# Patient Record
Sex: Female | Born: 1965 | Race: White | Hispanic: No | Marital: Single | Smoking: Current every day smoker
Health system: Southern US, Community
[De-identification: ages and names within clinical notes are randomized; demographics above are authoritative.]

## PROBLEM LIST (undated history)

## (undated) DIAGNOSIS — K5010 Crohn's disease of large intestine without complications: Secondary | ICD-10-CM

## (undated) DIAGNOSIS — M1990 Unspecified osteoarthritis, unspecified site: Secondary | ICD-10-CM

## (undated) DIAGNOSIS — E119 Type 2 diabetes mellitus without complications: Secondary | ICD-10-CM

## (undated) DIAGNOSIS — E785 Hyperlipidemia, unspecified: Secondary | ICD-10-CM

## (undated) DIAGNOSIS — I10 Essential (primary) hypertension: Secondary | ICD-10-CM

## (undated) HISTORY — PX: ABDOMINAL HYSTERECTOMY: SHX81

## (undated) HISTORY — PX: BACK SURGERY: SHX140

## (undated) HISTORY — PX: APPENDECTOMY: SHX54

## (undated) HISTORY — PX: KNEE ARTHROSCOPY: SUR90

## (undated) HISTORY — PX: BREAST SURGERY: SHX581

## (undated) HISTORY — PX: CARPAL TUNNEL RELEASE: SHX101

## (undated) HISTORY — PX: DIAGNOSTIC LAPAROSCOPY: SUR761

## (undated) HISTORY — DX: Essential (primary) hypertension: I10

## (undated) HISTORY — DX: Hyperlipidemia, unspecified: E78.5

## (undated) HISTORY — PX: CHOLECYSTECTOMY: SHX55

## (undated) HISTORY — DX: Crohn's disease of large intestine without complications: K50.10

---

## 2003-01-28 ENCOUNTER — Encounter: Admission: RE | Admit: 2003-01-28 | Discharge: 2003-01-28 | Payer: Self-pay | Admitting: Neurosurgery

## 2003-01-28 ENCOUNTER — Encounter: Payer: Self-pay | Admitting: Neurosurgery

## 2003-03-25 ENCOUNTER — Encounter: Payer: Self-pay | Admitting: Neurosurgery

## 2003-03-27 ENCOUNTER — Inpatient Hospital Stay (HOSPITAL_COMMUNITY): Admission: RE | Admit: 2003-03-27 | Discharge: 2003-03-30 | Payer: Self-pay | Admitting: Neurosurgery

## 2003-03-27 ENCOUNTER — Encounter: Payer: Self-pay | Admitting: Neurosurgery

## 2007-09-12 DIAGNOSIS — K5010 Crohn's disease of large intestine without complications: Secondary | ICD-10-CM

## 2007-09-12 HISTORY — DX: Crohn's disease of large intestine without complications: K50.10

## 2009-03-24 ENCOUNTER — Encounter: Admission: RE | Admit: 2009-03-24 | Discharge: 2009-03-24 | Payer: Self-pay | Admitting: Neurosurgery

## 2011-01-27 NOTE — Op Note (Signed)
Barbara Dyer, Barbara Dyer                             ACCOUNT NO.:  [id number]   MEDICAL RECORD NO.:  [id number]                   PATIENT TYPE:  INP   LOCATION:                                       FACILITY:  [facility]   PHYSICIAN:  Payton Doughty, M.D.                   DATE OF BIRTH:  Nov 09, 1965   DATE OF PROCEDURE:  03/27/2003  DATE OF DISCHARGE:  03/30/2003                                 OPERATIVE REPORT   PREOPERATIVE DIAGNOSIS:  Spondylosis L5-S1.   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   SURGEON:  Payton Doughty, M.D.   ANESTHESIA:  General endotracheal anesthesia.   PREPARATION:  The prep was scrub with alcohol wipe.   COMPLICATIONS:  None.   PROCEDURE:  L5-S1 laminectomy, diskectomy, posterior lumbar interbody fusion  with Ray anterior fusion cages and posterolateral arthrodesis with VITOSS  and DBX.   ASSISTANTS:  Nurse assistant Basilia Jumbo. Doctor assistant Stefani Dama,  M.D.   INDICATIONS FOR PROCEDURE:  This is a 46 year old girl with low back pain  and lumbar radiculopathy.   DESCRIPTION OF PROCEDURE:  She was taken to the operating room, intubated,  and placed prone on the operating table. Following sterile prepping and  draping in the usual sterile fashion, the skin was infiltrated with 1%  Lidocaine with 1:400,000 epinephrine.   The skin was incised from mid S1 to mid L4 and then the lamina of L4, L5 and  S1 were exposed bilaterally  in the subperiosteal plane including  the  transverse process of L5 in the sacral ala. Interoperative x-ray confirmed  the correct level. Having confirmed the correct level, the pars  intraarticularis, lamina and inferior facet of L5 and the superior facet of  S1 were removed bilaterally  with the high-speed drill and the bone was set  aside for grafting.   The right-sided anatomy demonstrated a little bit larger facet than on the  left of 5-1 with some facet arthropathy. The  left side demonstrated a pars  defect, spina bifida. Both sides were well decompressed and the ligament of  Flavum was dissected free and removed. The epidural fat was dissected free  and the L5 and S1 nerve roots were decompressed bilaterally.   A 5-1 diskectomy was then carried and Ray fusion cages were placed 12 x 21  mm. Interoperative x-rays showed good placement of the cages. They were  packed with bone graft harvested from the facet joints. The DBX was mixed  with VITOSS in the remaining bone, which was placed in the lateral gutter in  a transverse position after the transverse process of L5 and the sacral ala  had been decorticated with the high-speed drill. Prior to this the wound was  irrigated and hemostasis was assured.   The fascia was reapproximated with 0  Vicryl in an interrupted fashion. The  subcutaneous tissue was reapproximated with 0 Vicryl in an interrupted  fashion. The subcuticular tissue was reapproximated with 3-0 Vicryl in an  interrupted fashion. The skin was closed with 3-0 nylon in a running locked  fashion. A Betadine Telfa dressing was applied and an occlusive with OpSite  and the patient returned to the recovery room in good condition.                                                Payton Doughty, M.D.    MWR/MEDQ  D:  03/27/2003  T:  03/28/2003  Job:  [id number]

## 2011-01-27 NOTE — Discharge Summary (Signed)
   NAMEALANIE, Barbara Dyer                             ACCOUNT NO.:  [id number]   MEDICAL RECORD NO.:  [id number]                   PATIENT TYPE:  INP   LOCATION:  3005                                 FACILITY:  [facility]   PHYSICIAN:  Payton Doughty, M.D.                   DATE OF BIRTH:  1965-12-23   DATE OF ADMISSION:  03/27/2003  DATE OF DISCHARGE:  03/30/2003                                 DISCHARGE SUMMARY   ADMISSION DIAGNOSIS:  Spondylosis L5-S1.   DISCHARGE DIAGNOSIS:  Spondylolysis L5-S1 and spina bifida L5.   PROCEDURE:  L5-S1 laminectomy, diskectomy, lumbar interbody fusion with  right sided fusion cage and posterolateral arthrodesis.   COMPLICATIONS:  None.   CONDITION ON DISCHARGE:  Alive and well.   HISTORY OF PRESENT ILLNESS:  A 45 year old right-handed white girl whose  history and physical is recanted on the chart. She has had a long history of  back pain and positive diskography. Medical history has otherwise benign.   HOSPITAL COURSE:  She was admitted after ascertaining normal laboratory  values and underwent a 5-1 fusion.  Postoperatively, she has done reasonably  well.  PCA was removed the second postoperative day and Foley taken out the  first.  She participated in physical therapy and is up and walking about.  She had some numbness in her hands related to positioning which were  resolved. Incision is dry and well-healing. Strength is full.  She is being  discharged home in the care of her family with Percocet for pain.   FOLLOW UP:  In the [facility] office in a week for  sutures.                                               Payton Doughty, M.D.    MWR/MEDQ  D:  03/30/2003  T:  03/31/2003  Job:  [id number]

## 2011-01-27 NOTE — H&P (Signed)
Barbara Dyer, Barbara Dyer                             ACCOUNT NO.:  [id number]   MEDICAL RECORD NO.:  [id number]                   PATIENT TYPE:  INP   LOCATION:                                       FACILITY:  [facility]   PHYSICIAN:  Payton Doughty, M.D.                   DATE OF BIRTH:  03/29/1966   DATE OF ADMISSION:  03/27/2003  DATE OF DISCHARGE:  03/30/2003                                HISTORY & PHYSICAL   ADMISSION DIAGNOSIS:  Spondylosis at L5-S1.   HISTORY OF PRESENT ILLNESS:  This is a 45 year old right-handed white girl  who has had pain in her back and down her leg, and intermittent numbness in  her foot.  Participated in physical therapy.  She is on methadone which I  stopped.  She underwent epidural steroids which did not help her much, and  then she underwent diskography.  It was strongly and concurrently positive  at L5-S1, and she is now admitted for lumbar fusion.   PAST MEDICAL HISTORY:  Hormone difficulties.   MEDICATIONS:  1. Estratest once a day.  2. Clonidine daily for nerves.  3. Calcium.  4. Vitamin C.  5. Vitamin E.  6. Fiber and laxative.  7. A weight pill.  8. Chromium.   ALLERGIES:  1. NEURONTIN.  2. SULINDAC.   PAST SURGICAL HISTORY:  1. Knee operation in 2002.  2. Hysterectomy in 2000.  3. Appendectomy in 1999.  4. History of endometriosis.   SOCIAL HISTORY:  She smokes a pack of cigarettes a day, does not drink  alcohol.  Is a housewife.   FAMILY HISTORY:  Her mom is 34, good health with diabetes.  Dad is 34, in  good health.  She has a sister with spondylolysis.  Has had effusion.   REVIEW OF SYSTEMS:  Remarkable for fever, night sweats, _____________, sinus  problems, headaches, sore throat, hypercholesterolemia, changes in her hands  and feet, leg pain while walking, nausea, ulcers, abdominal pain, bowel  habit change, urinary tract infections, blood in the urine, endometriosis,  arm weakness, leg weakness, back pain, inability to  concentrate, anxiety and  depression.   PHYSICAL EXAMINATION:  HEENT:  Within normal limits.  NECK:  She has reasonable range of motion of the neck.  CHEST:  A few crackles.  CARDIOVASCULAR:  Regular rate and rhythm.  ABDOMEN:  Nontender, no hepatosplenomegaly.  EXTREMITIES:  Without clubbing or cyanosis.  Peripheral pulses are good.  GENITOURINARY:  Deferred.  NEUROLOGIC:  She is awake, alert, and oriented.  Cranial nerves are intact.  Motor exam shows 5/5 strength throughout the upper and lower extremities.  Reflexes are 1 at the knees and left ankle, absent at the right.  Straight  leg raise is mildly positive on the right.   LABORATORY DATA:  MRI demonstrates degenerative disk disease at L5-S1 with a  right-sided annular  bulge.   IMPRESSION:  Right S1 radiculopathy and degenerative disk disease at L5-S1.   PLAN:  Lumbar laminectomy and diskectomy, posterior lumbar interbody fusion  with a Ray threaded fusion cage and intertransverse bone.  The risks and  benefits of this approach has been discussed with her and she wishes to  proceed.                                                 Payton Doughty, M.D.    MWR/MEDQ  D:  03/27/2003  T:  03/28/2003  Job:  [id number]

## 2013-11-24 ENCOUNTER — Other Ambulatory Visit: Payer: Self-pay | Admitting: Neurosurgery

## 2013-11-24 DIAGNOSIS — M545 Low back pain, unspecified: Secondary | ICD-10-CM

## 2013-11-28 ENCOUNTER — Ambulatory Visit
Admission: RE | Admit: 2013-11-28 | Discharge: 2013-11-28 | Disposition: A | Discharge status: Home / Self Care | Payer: BC Managed Care – PPO | Source: Ambulatory Visit | Attending: Neurosurgery | Admitting: Neurosurgery

## 2013-11-28 VITALS — BP 109/81 | HR 94

## 2013-11-28 DIAGNOSIS — M545 Low back pain, unspecified: Secondary | ICD-10-CM

## 2013-11-28 DIAGNOSIS — M542 Cervicalgia: Secondary | ICD-10-CM

## 2013-11-28 MED ORDER — DIAZEPAM 5 MG PO TABS
10.0000 mg | ORAL_TABLET | Freq: Once | ORAL | Status: AC
Start: 2013-11-28 — End: 2013-11-28
  Administered 2013-11-28: 10 mg via ORAL

## 2013-11-28 MED ORDER — IOHEXOL 300 MG/ML  SOLN
10.0000 mL | Freq: Once | INTRAMUSCULAR | Status: AC | PRN
  Administered 2013-11-28: 10 mL via EPIDURAL

## 2013-11-28 MED ORDER — MEPERIDINE HCL 100 MG/ML IJ SOLN
100.0000 mg | Freq: Once | INTRAMUSCULAR | Status: AC
  Administered 2013-11-28: 100 mg via INTRAMUSCULAR

## 2013-11-28 MED ORDER — ONDANSETRON HCL 4 MG/2ML IJ SOLN
4.0000 mg | Freq: Once | INTRAMUSCULAR | Status: AC
Start: 2013-11-28 — End: 2013-11-28
  Administered 2013-11-28: 4 mg via INTRAMUSCULAR

## 2013-11-28 NOTE — Discharge Instructions (Signed)

## 2014-12-15 ENCOUNTER — Other Ambulatory Visit: Payer: Self-pay | Admitting: Physician Assistant

## 2014-12-15 NOTE — H&P (Signed)
TOTAL KNEE ADMISSION H&P  Patient is being admitted for right total knee arthroplasty.  Subjective:  Chief Complaint:right knee pain.  HPI: Barbara Dyer, 49 y.o. female, has a history of pain and functional disability in the right knee due to arthritis and has failed non-surgical conservative treatments for greater than 12 weeks to includeNSAID's and/or analgesics, corticosteriod injections and use of assistive devices.  Onset of symptoms was gradual, starting >10 years ago with rapidlly worsening course since that time. The patient noted prior procedures on the knee to include  arthroscopy and menisectomy on the right knee(s).  Patient currently rates pain in the right knee(s) at 7 out of 10 with activity. Patient has night pain, worsening of pain with activity and weight bearing and crepitus.  Patient has evidence of subchondral sclerosis and joint space narrowing by imaging studies. There is no active infection.  There are no active problems to display for this patient.  No past medical history on file.  No past surgical history on file.   (Not in a hospital admission) Allergies  Allergen Reactions  . Morphine And Related Hives and Other (See Comments)    Tachycardia   . Prednisone Hives and Other (See Comments)    Tachycardia (with PO steroids; has never had steroid injections)   . Humulin [Insulin Nph Isophane & Regular] Hives and Rash  . Tape Dermatitis    History  Substance Use Topics  . Smoking status: Not on file  . Smokeless tobacco: Not on file  . Alcohol Use: Not on file    No family history on file.   Review of Systems  Constitutional: Negative.   HENT: Negative.   Eyes: Negative.   Respiratory: Negative.   Cardiovascular: Negative.   Gastrointestinal: Negative.   Genitourinary: Positive for frequency. Negative for dysuria, urgency and hematuria.  Musculoskeletal: Positive for joint pain.  Skin: Negative.   Neurological: Negative.   Endo/Heme/Allergies:  Negative.   Psychiatric/Behavioral: Negative.     Objective:  Physical Exam  Constitutional: She is oriented to person, place, and time. She appears well-developed and well-nourished.  HENT:  Head: Normocephalic and atraumatic.  Eyes: EOM are normal. Pupils are equal, round, and reactive to light.  Neck: Normal range of motion. Neck supple.  Cardiovascular: Normal rate and regular rhythm.  Exam reveals no gallop and no friction rub.   No murmur heard. Respiratory: Effort normal and breath sounds normal. No respiratory distress. She has no wheezes. She has no rales.  GI: Soft. Bowel sounds are normal. She exhibits no distension. There is no tenderness.  Musculoskeletal:  Markedly antalgic gait.  Utilizes a cane. Negative log roll both hips, no trochanteric tenderness.  Both knees have reasonable alignment.  She can still kind of full extension, and better than 100 degrees of flexion.  Relatively  profound patellofemoral crepitus, right greater than left.  Marked pain with patellar compression.  A little tender medial and lateral joint line, both knees.   Neurological: She is alert and oriented to person, place, and time.  Skin: Skin is warm and dry.  Psychiatric: She has a normal mood and affect. Her behavior is normal. Judgment and thought content normal.    Vital signs in last 24 hours: @VSRANGES @  Labs:   There is no height or weight on file to calculate BMI.   Imaging Review Plain radiographs demonstrate severe degenerative joint disease of the right knee(s). The overall alignment isneutral. The bone quality appears to be fair for age and reported activity  level.  Assessment/Plan:  End stage arthritis, right knee   The patient history, physical examination, clinical judgment of the provider and imaging studies are consistent with end stage degenerative joint disease of the right knee(s) and total knee arthroplasty is deemed medically necessary. The treatment options  including medical management, injection therapy arthroscopy and arthroplasty were discussed at length. The risks and benefits of total knee arthroplasty were presented and reviewed. The risks due to aseptic loosening, infection, stiffness, patella tracking problems, thromboembolic complications and other imponderables were discussed. The patient acknowledged the explanation, agreed to proceed with the plan and consent was signed. Patient is being admitted for inpatient treatment for surgery, pain control, PT, OT, prophylactic antibiotics, VTE prophylaxis, progressive ambulation and ADL's and discharge planning. The patient is planning to be discharged home with home health services

## 2014-12-18 ENCOUNTER — Inpatient Hospital Stay (HOSPITAL_COMMUNITY)
Admission: RE | Admit: 2014-12-18 | Discharge: 2014-12-18 | Disposition: A | Discharge status: Home / Self Care | Payer: Self-pay | Source: Ambulatory Visit

## 2014-12-18 NOTE — Pre-Procedure Instructions (Signed)
Barbara Dyer  12/18/2014   Your procedure is scheduled on:  12/30/14  Report to [facility] cone short stay admitting at 800 AM.  Call this number if you have problems the morning of surgery: [phone]   Remember:   Do not eat food or drink liquids after midnight.   Take these medicines the morning of surgery with A SIP OF WATER: tylenol if needed   STOP all herbel meds, nsaids (aleve,naproxen,advil,ibuprofen) 5 days prior to surgery starting 12/25/14 including Multi vit, black cohosh, calcium, digestive enzyme, digestive advantage,flaxseed.aspirin     Do not wear jewelry, make-up or nail polish.  Do not wear lotions, powders, or perfumes. You may wear deodorant.  Do not shave 48 hours prior to surgery. Men may shave face and neck.  Do not bring valuables to the hospital.  [facility] is not responsible                  for any belongings or valuables.               Contacts, dentures or bridgework may not be worn into surgery.  Leave suitcase in the car. After surgery it may be brought to your room.  For patients admitted to the hospital, discharge time is determined by your                treatment team.               Patients discharged the day of surgery will not be allowed to drive  home.  Name and phone number of your driver:   Special Instructions:  Special Instructions: [facility] - Preparing for Surgery  Before surgery, you can play an important role.  Because skin is not sterile, your skin needs to be as free of germs as possible.  You can reduce the number of germs on you skin by washing with CHG (chlorahexidine gluconate) soap before surgery.  CHG is an antiseptic cleaner which kills germs and bonds with the skin to continue killing germs even after washing.  Please DO NOT use if you have an allergy to CHG or antibacterial soaps.  If your skin becomes reddened/irritated stop using the CHG and inform your nurse when you arrive at Short Stay.  Do not shave (including legs and  underarms) for at least 48 hours prior to the first CHG shower.  You may shave your face.  Please follow these instructions carefully:   1.  Shower with CHG Soap the night before surgery and the morning of Surgery.  2.  If you choose to wash your hair, wash your hair first as usual with your normal shampoo.  3.  After you shampoo, rinse your hair and body thoroughly to remove the Shampoo.  4.  Use CHG as you would any other liquid soap.  You can apply chg directly  to the skin and wash gently with scrungie or a clean washcloth.  5.  Apply the CHG Soap to your body ONLY FROM THE NECK DOWN.  Do not use on open wounds or open sores.  Avoid contact with your eyes ears, mouth and genitals (private parts).  Wash genitals (private parts)       with your normal soap.  6.  Wash thoroughly, paying special attention to the area where your surgery will be performed.  7.  Thoroughly rinse your body with warm water from the neck down.  8.  DO NOT shower/wash with your normal soap after using and  rinsing off the CHG Soap.  9.  Pat yourself dry with a clean towel.            10.  Wear clean pajamas.            11.  Place clean sheets on your bed the night of your first shower and do not sleep with pets.  Day of Surgery  Do not apply any lotions/deodorants the morning of surgery.  Please wear clean clothes to the hospital/surgery center.   Please read over the following fact sheets that you were given: Pain Booklet, Coughing and Deep Breathing, Blood Transfusion Information, Total Joint Packet, MRSA Information and Surgical Site Infection Prevention

## 2014-12-25 ENCOUNTER — Encounter (HOSPITAL_COMMUNITY): Payer: Self-pay

## 2014-12-25 ENCOUNTER — Encounter (HOSPITAL_COMMUNITY)
Admission: RE | Admit: 2014-12-25 | Discharge: 2014-12-25 | Disposition: A | Discharge status: Home / Self Care | Payer: BLUE CROSS/BLUE SHIELD | Source: Ambulatory Visit | Attending: Orthopedic Surgery | Admitting: Orthopedic Surgery

## 2014-12-25 DIAGNOSIS — Z01812 Encounter for preprocedural laboratory examination: Secondary | ICD-10-CM | POA: Diagnosis not present

## 2014-12-25 HISTORY — DX: Unspecified osteoarthritis, unspecified site: M19.90

## 2014-12-25 HISTORY — DX: Type 2 diabetes mellitus without complications: E11.9

## 2014-12-25 LAB — COMPREHENSIVE METABOLIC PANEL
ALBUMIN: 4.2 g/dL (ref 3.5–5.2)
ALT: 19 U/L (ref 0–35)
ANION GAP: 13 (ref 5–15)
AST: 26 U/L (ref 0–37)
Alkaline Phosphatase: 113 U/L (ref 39–117)
BUN: 9 mg/dL (ref 6–23)
CALCIUM: 9.9 mg/dL (ref 8.4–10.5)
CO2: 24 mmol/L (ref 19–32)
CREATININE: 0.67 mg/dL (ref 0.50–1.10)
Chloride: 101 mmol/L (ref 96–112)
Glucose, Bld: 154 mg/dL — ABNORMAL HIGH (ref 70–99)
Potassium: 4.5 mmol/L (ref 3.5–5.1)
Sodium: 138 mmol/L (ref 135–145)
Total Bilirubin: 0.4 mg/dL (ref 0.3–1.2)
Total Protein: 7.2 g/dL (ref 6.0–8.3)

## 2014-12-25 LAB — URINE MICROSCOPIC-ADD ON

## 2014-12-25 LAB — URINALYSIS, ROUTINE W REFLEX MICROSCOPIC
Bilirubin Urine: NEGATIVE
Glucose, UA: NEGATIVE mg/dL
Ketones, ur: NEGATIVE mg/dL
LEUKOCYTES UA: NEGATIVE
Nitrite: NEGATIVE
Protein, ur: NEGATIVE mg/dL
Specific Gravity, Urine: 1.023 (ref 1.005–1.030)
UROBILINOGEN UA: 0.2 mg/dL (ref 0.0–1.0)
pH: 5.5 (ref 5.0–8.0)

## 2014-12-25 LAB — PROTIME-INR
INR: 0.97 (ref 0.00–1.49)
Prothrombin Time: 13 seconds (ref 11.6–15.2)

## 2014-12-25 LAB — CBC WITH DIFFERENTIAL/PLATELET
BASOS ABS: 0.1 10*3/uL (ref 0.0–0.1)
Basophils Relative: 1 % (ref 0–1)
Eosinophils Absolute: 0.1 10*3/uL (ref 0.0–0.7)
Eosinophils Relative: 1 % (ref 0–5)
HCT: 46.7 % — ABNORMAL HIGH (ref 36.0–46.0)
Hemoglobin: 16.5 g/dL — ABNORMAL HIGH (ref 12.0–15.0)
Lymphocytes Relative: 27 % (ref 12–46)
Lymphs Abs: 2.9 10*3/uL (ref 0.7–4.0)
MCH: 32.6 pg (ref 26.0–34.0)
MCHC: 35.3 g/dL (ref 30.0–36.0)
MCV: 92.3 fL (ref 78.0–100.0)
Monocytes Absolute: 0.6 10*3/uL (ref 0.1–1.0)
Monocytes Relative: 6 % (ref 3–12)
Neutro Abs: 6.8 10*3/uL (ref 1.7–7.7)
Neutrophils Relative %: 65 % (ref 43–77)
Platelets: 250 10*3/uL (ref 150–400)
RBC: 5.06 MIL/uL (ref 3.87–5.11)
RDW: 12.2 % (ref 11.5–15.5)
WBC: 10.4 10*3/uL (ref 4.0–10.5)

## 2014-12-25 LAB — SURGICAL PCR SCREEN
MRSA, PCR: NEGATIVE
STAPHYLOCOCCUS AUREUS: NEGATIVE

## 2014-12-25 LAB — ABO/RH: ABO/RH(D): O POS

## 2014-12-25 LAB — APTT: APTT: 32 s (ref 24–37)

## 2014-12-25 LAB — TYPE AND SCREEN
ABO/RH(D): O POS
Antibody Screen: NEGATIVE

## 2014-12-25 NOTE — Progress Notes (Addendum)
req'd stress test done last week, ekg, office notes from dr Karl Pock [place]/[facility] [phone]

## 2014-12-25 NOTE — Pre-Procedure Instructions (Addendum)
Barbara Dyer  12/25/2014   Your procedure is scheduled on:  12/30/14  Report to [facility] short stay admitting at 800 AM.  Call this number if you have problems the morning of surgery: [phone]   Remember:   Do not eat food or drink liquids after midnight.   Take these medicines the morning of surgery with A SIP OF WATER: tylenol if needed   STOP all herbel meds, nsaids (aleve,naproxen,advil,ibuprofen) 5 days prior to surgery starting 12/25/14 including Multi vit, black cohosh, calcium, digestive enzyme, digestive advantage,flaxseed.aspirin           No metformin am of surgery    Do not wear jewelry, make-up or nail polish.  Do not wear lotions, powders, or perfumes. You may wear deodorant.  Do not shave 48 hours prior to surgery. Men may shave face and neck.  Do not bring valuables to the hospital.  [facility] is not responsible                  for any belongings or valuables.               Contacts, dentures or bridgework may not be worn into surgery.  Leave suitcase in the car. After surgery it may be brought to your room.  For patients admitted to the hospital, discharge time is determined by your                treatment team.               Patients discharged the day of surgery will not be allowed to drive  home.  Name and phone number of your driver:   Special Instructions:  Special Instructions: [facility] - Preparing for Surgery  Before surgery, you can play an important role.  Because skin is not sterile, your skin needs to be as free of germs as possible.  You can reduce the number of germs on you skin by washing with CHG (chlorahexidine gluconate) soap before surgery.  CHG is an antiseptic cleaner which kills germs and bonds with the skin to continue killing germs even after washing.  Please DO NOT use if you have an allergy to CHG or antibacterial soaps.  If your skin becomes reddened/irritated stop using the CHG and inform your nurse when you arrive at Short  Stay.  Do not shave (including legs and underarms) for at least 48 hours prior to the first CHG shower.  You may shave your face.  Please follow these instructions carefully:   1.  Shower with CHG Soap the night before surgery and the morning of Surgery.  2.  If you choose to wash your hair, wash your hair first as usual with your normal shampoo.  3.  After you shampoo, rinse your hair and body thoroughly to remove the Shampoo.  4.  Use CHG as you would any other liquid soap.  You can apply chg directly  to the skin and wash gently with scrungie or a clean washcloth.  5.  Apply the CHG Soap to your body ONLY FROM THE NECK DOWN.  Do not use on open wounds or open sores.  Avoid contact with your eyes ears, mouth and genitals (private parts).  Wash genitals (private parts)       with your normal soap.  6.  Wash thoroughly, paying special attention to the area where your surgery will be performed.  7.  Thoroughly rinse your body with warm water from the neck  down.  8.  DO NOT shower/wash with your normal soap after using and rinsing off the CHG Soap.  9.  Pat yourself dry with a clean towel.            10.  Wear clean pajamas.            11.  Place clean sheets on your bed the night of your first shower and do not sleep with pets.  Day of Surgery  Do not apply any lotions/deodorants the morning of surgery.  Please wear clean clothes to the hospital/surgery center.   Please read over the following fact sheets that you were given: Pain Booklet, Coughing and Deep Breathing, Blood Transfusion Information, Total Joint Packet, MRSA Information and Surgical Site Infection Prevention

## 2014-12-26 LAB — URINE CULTURE: Colony Count: 85000

## 2014-12-28 NOTE — Progress Notes (Signed)
Anesthesia Chart Review:  Patient is a 49 year old female scheduled for right TKA on 12/30/14 by Dr. Kathryne Dyer.  History includes smoking, DM2, arthritis, hysterectomy, back surgery.  PCP is Dr. Abelino Dyer in [place] [facility]; see Care Everywhere). Dr. Ethelene Dyer cleared her for this procedure (Dr. Debroah Dyer office to fax clearance.)  She had a negative dobutamine stress echo on 12/18/14. EF 60%, no resting segmental abnormality, no clinical or echocardiographic evidence of ischemia, 7METS.   12/04/14 EKG: ST at 100 bpm, poor r wave progression consider anterior infarct (age undetermined), low QRS voltage in precordial leads.  Preoperative labs noted. Urine culture showed 85,000 colonies, non-predominant.  If no acute changes then I anticipate that she can proceed as planned.  Barbara Dyer [facility] Short Stay Center/Anesthesiology Phone [phone] 12/28/2014 3:04 PM

## 2014-12-29 MED ORDER — LACTATED RINGERS IV SOLN
INTRAVENOUS | Status: DC
  Administered 2014-12-30: 09:00:00 via INTRAVENOUS

## 2014-12-29 MED ORDER — CHLORHEXIDINE GLUCONATE 4 % EX LIQD
60.0000 mL | Freq: Once | CUTANEOUS | Status: DC
Start: 2014-12-29 — End: 2014-12-30
  Filled 2014-12-29: qty 60

## 2014-12-29 MED ORDER — CHLORHEXIDINE GLUCONATE 4 % EX LIQD
60.0000 mL | Freq: Once | CUTANEOUS | Status: DC
  Filled 2014-12-29: qty 60

## 2014-12-29 MED ORDER — CEFAZOLIN SODIUM-DEXTROSE 2-3 GM-% IV SOLR
2.0000 g | INTRAVENOUS | Status: AC
  Administered 2014-12-30: 2 g via INTRAVENOUS
  Filled 2014-12-29: qty 50

## 2014-12-30 ENCOUNTER — Inpatient Hospital Stay (HOSPITAL_COMMUNITY)
Admission: RE | Admit: 2014-12-30 | Discharge: 2015-01-01 | DRG: 470 | Disposition: A | Discharge status: Home Health | Payer: BLUE CROSS/BLUE SHIELD | Source: Ambulatory Visit | Attending: Orthopedic Surgery | Admitting: Orthopedic Surgery

## 2014-12-30 ENCOUNTER — Inpatient Hospital Stay (HOSPITAL_COMMUNITY): Payer: BLUE CROSS/BLUE SHIELD | Admitting: Anesthesiology

## 2014-12-30 ENCOUNTER — Encounter (HOSPITAL_COMMUNITY): Payer: Self-pay | Admitting: *Deleted

## 2014-12-30 ENCOUNTER — Other Ambulatory Visit: Payer: Self-pay

## 2014-12-30 ENCOUNTER — Inpatient Hospital Stay (HOSPITAL_COMMUNITY): Payer: BLUE CROSS/BLUE SHIELD

## 2014-12-30 ENCOUNTER — Encounter (HOSPITAL_COMMUNITY)
Admission: RE | Disposition: A | Discharge status: Home Health | Payer: Self-pay | Source: Ambulatory Visit | Attending: Orthopedic Surgery

## 2014-12-30 ENCOUNTER — Inpatient Hospital Stay (HOSPITAL_COMMUNITY): Payer: BLUE CROSS/BLUE SHIELD | Admitting: Vascular Surgery

## 2014-12-30 DIAGNOSIS — Z79899 Other long term (current) drug therapy: Secondary | ICD-10-CM

## 2014-12-30 DIAGNOSIS — T402X5A Adverse effect of other opioids, initial encounter: Secondary | ICD-10-CM | POA: Diagnosis not present

## 2014-12-30 DIAGNOSIS — Z888 Allergy status to other drugs, medicaments and biological substances status: Secondary | ICD-10-CM

## 2014-12-30 DIAGNOSIS — E119 Type 2 diabetes mellitus without complications: Secondary | ICD-10-CM | POA: Diagnosis present

## 2014-12-30 DIAGNOSIS — Z885 Allergy status to narcotic agent status: Secondary | ICD-10-CM | POA: Diagnosis not present

## 2014-12-30 DIAGNOSIS — M1711 Unilateral primary osteoarthritis, right knee: Principal | ICD-10-CM | POA: Diagnosis present

## 2014-12-30 DIAGNOSIS — M179 Osteoarthritis of knee, unspecified: Secondary | ICD-10-CM | POA: Diagnosis present

## 2014-12-30 DIAGNOSIS — R42 Dizziness and giddiness: Secondary | ICD-10-CM | POA: Diagnosis not present

## 2014-12-30 DIAGNOSIS — R110 Nausea: Secondary | ICD-10-CM | POA: Diagnosis not present

## 2014-12-30 DIAGNOSIS — Z7902 Long term (current) use of antithrombotics/antiplatelets: Secondary | ICD-10-CM

## 2014-12-30 DIAGNOSIS — Z96659 Presence of unspecified artificial knee joint: Secondary | ICD-10-CM

## 2014-12-30 DIAGNOSIS — M1710 Unilateral primary osteoarthritis, unspecified knee: Secondary | ICD-10-CM | POA: Diagnosis present

## 2014-12-30 DIAGNOSIS — F17200 Nicotine dependence, unspecified, uncomplicated: Secondary | ICD-10-CM | POA: Diagnosis present

## 2014-12-30 DIAGNOSIS — Y92230 Patient room in hospital as the place of occurrence of the external cause: Secondary | ICD-10-CM | POA: Diagnosis not present

## 2014-12-30 DIAGNOSIS — M25561 Pain in right knee: Secondary | ICD-10-CM | POA: Diagnosis present

## 2014-12-30 DIAGNOSIS — Z96651 Presence of right artificial knee joint: Secondary | ICD-10-CM

## 2014-12-30 HISTORY — PX: TOTAL KNEE ARTHROPLASTY: SHX125

## 2014-12-30 LAB — GLUCOSE, CAPILLARY
GLUCOSE-CAPILLARY: 118 mg/dL — AB (ref 70–99)
Glucose-Capillary: 169 mg/dL — ABNORMAL HIGH (ref 70–99)
Glucose-Capillary: 174 mg/dL — ABNORMAL HIGH (ref 70–99)

## 2014-12-30 SURGERY — ARTHROPLASTY, KNEE, TOTAL
Anesthesia: Monitor Anesthesia Care | Site: Knee | Laterality: Right

## 2014-12-30 MED ORDER — PROPOFOL 10 MG/ML IV BOLUS
INTRAVENOUS | Status: AC
  Filled 2014-12-30: qty 20

## 2014-12-30 MED ORDER — LORATADINE 10 MG PO TABS
10.0000 mg | ORAL_TABLET | Freq: Every day | ORAL | Status: DC
  Administered 2014-12-31 – 2015-01-01 (×2): 10 mg via ORAL
  Filled 2014-12-30 (×2): qty 1

## 2014-12-30 MED ORDER — PHENYLEPHRINE 40 MCG/ML (10ML) SYRINGE FOR IV PUSH (FOR BLOOD PRESSURE SUPPORT)
PREFILLED_SYRINGE | INTRAVENOUS | Status: AC
  Filled 2014-12-30: qty 10

## 2014-12-30 MED ORDER — DEXMEDETOMIDINE HCL 200 MCG/2ML IV SOLN
INTRAVENOUS | Status: DC | PRN
  Administered 2014-12-30: 8 ug via INTRAVENOUS
  Administered 2014-12-30: 12 ug via INTRAVENOUS

## 2014-12-30 MED ORDER — BISACODYL 5 MG PO TBEC: 5.0000 mg | DELAYED_RELEASE_TABLET | Freq: Every day | ORAL | Status: DC | PRN

## 2014-12-30 MED ORDER — METOCLOPRAMIDE HCL 5 MG/ML IJ SOLN: 5.0000 mg | Freq: Three times a day (TID) | INTRAMUSCULAR | Status: DC | PRN

## 2014-12-30 MED ORDER — OXYCODONE HCL 5 MG PO TABS
ORAL_TABLET | ORAL | Status: AC
  Filled 2014-12-30: qty 2

## 2014-12-30 MED ORDER — MIDAZOLAM HCL 2 MG/2ML IJ SOLN
INTRAMUSCULAR | Status: AC
  Filled 2014-12-30: qty 2

## 2014-12-30 MED ORDER — CALCIUM POLYCARBOPHIL 625 MG PO TABS
1250.0000 mg | ORAL_TABLET | Freq: Every day | ORAL | Status: DC
  Administered 2014-12-31: 10:00:00 via ORAL
  Administered 2015-01-01: 1250 mg via ORAL
  Filled 2014-12-30 (×3): qty 2

## 2014-12-30 MED ORDER — ATORVASTATIN CALCIUM 10 MG PO TABS
20.0000 mg | ORAL_TABLET | Freq: Every day | ORAL | Status: DC
  Administered 2014-12-31: 20 mg via ORAL
  Filled 2014-12-30 (×4): qty 2

## 2014-12-30 MED ORDER — CEFAZOLIN SODIUM-DEXTROSE 2-3 GM-% IV SOLR
2.0000 g | Freq: Four times a day (QID) | INTRAVENOUS | Status: AC
  Administered 2014-12-30 – 2014-12-31 (×2): 2 g via INTRAVENOUS
  Filled 2014-12-30 (×3): qty 50

## 2014-12-30 MED ORDER — METHOCARBAMOL 1000 MG/10ML IJ SOLN
500.0000 mg | Freq: Four times a day (QID) | INTRAVENOUS | Status: DC | PRN
  Filled 2014-12-30: qty 5

## 2014-12-30 MED ORDER — ONDANSETRON HCL 4 MG/2ML IJ SOLN
INTRAMUSCULAR | Status: AC
  Filled 2014-12-30: qty 2

## 2014-12-30 MED ORDER — SENNOSIDES-DOCUSATE SODIUM 8.6-50 MG PO TABS
1.0000 | ORAL_TABLET | Freq: Every evening | ORAL | Status: DC | PRN
  Filled 2014-12-30: qty 1

## 2014-12-30 MED ORDER — 0.9 % SODIUM CHLORIDE (POUR BTL) OPTIME
TOPICAL | Status: DC | PRN
  Administered 2014-12-30: 1000 mL

## 2014-12-30 MED ORDER — FLEET ENEMA 7-19 GM/118ML RE ENEM: 1.0000 | ENEMA | Freq: Once | RECTAL | Status: AC | PRN

## 2014-12-30 MED ORDER — HYDROMORPHONE HCL 1 MG/ML IJ SOLN
0.5000 mg | INTRAMUSCULAR | Status: DC | PRN
  Administered 2014-12-30 – 2014-12-31 (×5): 1 mg via INTRAVENOUS
  Filled 2014-12-30 (×5): qty 1

## 2014-12-30 MED ORDER — MENTHOL 3 MG MT LOZG: 1.0000 | LOZENGE | OROMUCOSAL | Status: DC | PRN

## 2014-12-30 MED ORDER — OXYCODONE HCL 5 MG PO TABS
5.0000 mg | ORAL_TABLET | ORAL | Status: DC | PRN
  Administered 2014-12-30 – 2015-01-01 (×14): 10 mg via ORAL
  Filled 2014-12-30 (×13): qty 2

## 2014-12-30 MED ORDER — OXYCODONE-ACETAMINOPHEN 5-325 MG PO TABS: 1.0000 | ORAL_TABLET | ORAL | Status: DC | PRN

## 2014-12-30 MED ORDER — APIXABAN 2.5 MG PO TABS: ORAL_TABLET | ORAL | Status: DC

## 2014-12-30 MED ORDER — METHOCARBAMOL 500 MG PO TABS
500.0000 mg | ORAL_TABLET | Freq: Four times a day (QID) | ORAL | Status: DC | PRN
  Administered 2014-12-30 – 2015-01-01 (×5): 500 mg via ORAL
  Filled 2014-12-30 (×6): qty 1

## 2014-12-30 MED ORDER — FENTANYL CITRATE (PF) 250 MCG/5ML IJ SOLN
INTRAMUSCULAR | Status: AC
  Filled 2014-12-30: qty 5

## 2014-12-30 MED ORDER — SODIUM CHLORIDE 0.9 % IR SOLN
Status: DC | PRN
  Administered 2014-12-30 (×2): 1000 mL

## 2014-12-30 MED ORDER — ONDANSETRON HCL 4 MG/2ML IJ SOLN: 4.0000 mg | Freq: Four times a day (QID) | INTRAMUSCULAR | Status: DC | PRN

## 2014-12-30 MED ORDER — ACETAMINOPHEN 325 MG PO TABS
ORAL_TABLET | ORAL | Status: AC
  Administered 2014-12-30: 975 mg
  Filled 2014-12-30: qty 3

## 2014-12-30 MED ORDER — METFORMIN HCL ER 500 MG PO TB24
500.0000 mg | ORAL_TABLET | Freq: Every day | ORAL | Status: DC
  Administered 2014-12-31 – 2015-01-01 (×2): 500 mg via ORAL
  Filled 2014-12-30 (×2): qty 1

## 2014-12-30 MED ORDER — ONDANSETRON HCL 4 MG PO TABS: 4.0000 mg | ORAL_TABLET | Freq: Three times a day (TID) | ORAL | Status: DC | PRN

## 2014-12-30 MED ORDER — CALCIUM CARBONATE ANTACID 500 MG PO CHEW
8.0000 | CHEWABLE_TABLET | Freq: Every day | ORAL | Status: DC
  Administered 2014-12-31: 800 mg via ORAL
  Administered 2015-01-01: 1600 mg via ORAL
  Filled 2014-12-30 (×2): qty 4

## 2014-12-30 MED ORDER — DEXMEDETOMIDINE HCL IN NACL 200 MCG/50ML IV SOLN
INTRAVENOUS | Status: AC
  Filled 2014-12-30: qty 50

## 2014-12-30 MED ORDER — ZOLPIDEM TARTRATE 5 MG PO TABS: 5.0000 mg | ORAL_TABLET | Freq: Every evening | ORAL | Status: DC | PRN

## 2014-12-30 MED ORDER — ACETAMINOPHEN 500 MG PO TABS
1000.0000 mg | ORAL_TABLET | Freq: Four times a day (QID) | ORAL | Status: AC
  Administered 2014-12-30 – 2014-12-31 (×4): 1000 mg via ORAL
  Filled 2014-12-30 (×7): qty 2

## 2014-12-30 MED ORDER — LIDOCAINE HCL (CARDIAC) 20 MG/ML IV SOLN
INTRAVENOUS | Status: DC | PRN
  Administered 2014-12-30: 80 mg via INTRAVENOUS

## 2014-12-30 MED ORDER — METHOCARBAMOL 500 MG PO TABS: 500.0000 mg | ORAL_TABLET | Freq: Four times a day (QID) | ORAL | Status: DC

## 2014-12-30 MED ORDER — HYDROMORPHONE HCL 1 MG/ML IJ SOLN
INTRAMUSCULAR | Status: AC
  Filled 2014-12-30: qty 1

## 2014-12-30 MED ORDER — HYDROMORPHONE HCL 1 MG/ML IJ SOLN
INTRAMUSCULAR | Status: AC
  Administered 2014-12-30: 0.5 mg via INTRAVENOUS
  Filled 2014-12-30: qty 1

## 2014-12-30 MED ORDER — ACETAMINOPHEN 325 MG PO TABS: 650.0000 mg | ORAL_TABLET | Freq: Four times a day (QID) | ORAL | Status: DC | PRN

## 2014-12-30 MED ORDER — BUPIVACAINE LIPOSOME 1.3 % IJ SUSP
20.0000 mL | INTRAMUSCULAR | Status: AC
  Administered 2014-12-30: 20 mL
  Filled 2014-12-30: qty 20

## 2014-12-30 MED ORDER — BISACODYL 5 MG PO TBEC
5.0000 mg | DELAYED_RELEASE_TABLET | Freq: Every day | ORAL | Status: DC | PRN
  Administered 2014-12-31: 5 mg via ORAL
  Filled 2014-12-30 (×2): qty 1

## 2014-12-30 MED ORDER — PROMETHAZINE HCL 25 MG/ML IJ SOLN
INTRAMUSCULAR | Status: AC
  Filled 2014-12-30: qty 1

## 2014-12-30 MED ORDER — LACTATED RINGERS IV SOLN
INTRAVENOUS | Status: DC | PRN
  Administered 2014-12-30 (×3): via INTRAVENOUS

## 2014-12-30 MED ORDER — ONDANSETRON HCL 4 MG PO TABS: 4.0000 mg | ORAL_TABLET | Freq: Four times a day (QID) | ORAL | Status: DC | PRN

## 2014-12-30 MED ORDER — DIPHENHYDRAMINE HCL 12.5 MG/5ML PO ELIX: 12.5000 mg | ORAL_SOLUTION | ORAL | Status: DC | PRN

## 2014-12-30 MED ORDER — LIDOCAINE HCL (CARDIAC) 20 MG/ML IV SOLN
INTRAVENOUS | Status: AC
  Filled 2014-12-30: qty 5

## 2014-12-30 MED ORDER — PROMETHAZINE HCL 25 MG/ML IJ SOLN
6.2500 mg | INTRAMUSCULAR | Status: DC | PRN
  Administered 2014-12-30: 6.25 mg via INTRAVENOUS

## 2014-12-30 MED ORDER — BUPIVACAINE HCL 0.5 % IJ SOLN
INTRAMUSCULAR | Status: DC | PRN
  Administered 2014-12-30: 10 mL

## 2014-12-30 MED ORDER — METHOCARBAMOL 1000 MG/10ML IJ SOLN
500.0000 mg | INTRAVENOUS | Status: AC
  Administered 2014-12-30: 500 mg via INTRAVENOUS
  Filled 2014-12-30: qty 5

## 2014-12-30 MED ORDER — ACETAMINOPHEN 650 MG RE SUPP: 650.0000 mg | Freq: Four times a day (QID) | RECTAL | Status: DC | PRN

## 2014-12-30 MED ORDER — POTASSIUM CHLORIDE IN NACL 20-0.9 MEQ/L-% IV SOLN
INTRAVENOUS | Status: DC
  Administered 2014-12-30: 18:00:00 via INTRAVENOUS
  Filled 2014-12-30 (×3): qty 1000

## 2014-12-30 MED ORDER — PROPOFOL INFUSION 10 MG/ML OPTIME
INTRAVENOUS | Status: DC | PRN
  Administered 2014-12-30: 45 ug/kg/min via INTRAVENOUS

## 2014-12-30 MED ORDER — METOCLOPRAMIDE HCL 5 MG PO TABS: 5.0000 mg | ORAL_TABLET | Freq: Three times a day (TID) | ORAL | Status: DC | PRN

## 2014-12-30 MED ORDER — HYDROMORPHONE HCL 1 MG/ML IJ SOLN
0.2500 mg | INTRAMUSCULAR | Status: DC | PRN
  Administered 2014-12-30 (×5): 0.5 mg via INTRAVENOUS

## 2014-12-30 MED ORDER — APIXABAN 2.5 MG PO TABS
2.5000 mg | ORAL_TABLET | Freq: Two times a day (BID) | ORAL | Status: DC
  Administered 2014-12-31 – 2015-01-01 (×3): 2.5 mg via ORAL
  Filled 2014-12-30 (×3): qty 1

## 2014-12-30 MED ORDER — FENTANYL CITRATE (PF) 100 MCG/2ML IJ SOLN
INTRAMUSCULAR | Status: DC | PRN
  Administered 2014-12-30 (×5): 50 ug via INTRAVENOUS

## 2014-12-30 MED ORDER — PHENYLEPHRINE HCL 10 MG/ML IJ SOLN
INTRAMUSCULAR | Status: DC | PRN
  Administered 2014-12-30: 80 ug via INTRAVENOUS
  Administered 2014-12-30 (×4): 40 ug via INTRAVENOUS

## 2014-12-30 MED ORDER — PHENOL 1.4 % MT LIQD: 1.0000 | OROMUCOSAL | Status: DC | PRN

## 2014-12-30 MED ORDER — ROCURONIUM BROMIDE 50 MG/5ML IV SOLN
INTRAVENOUS | Status: AC
  Filled 2014-12-30: qty 1

## 2014-12-30 SURGICAL SUPPLY — 69 items
BANDAGE ELASTIC 4 VELCRO ST LF (GAUZE/BANDAGES/DRESSINGS) ×3 IMPLANT
BANDAGE ELASTIC 6 VELCRO ST LF (GAUZE/BANDAGES/DRESSINGS) ×3 IMPLANT
BANDAGE ESMARK 6X9 LF (GAUZE/BANDAGES/DRESSINGS) ×1 IMPLANT
BENZOIN TINCTURE PRP APPL 2/3 (GAUZE/BANDAGES/DRESSINGS) ×3 IMPLANT
BLADE SAG 18X100X1.27 (BLADE) ×6 IMPLANT
BNDG ESMARK 6X9 LF (GAUZE/BANDAGES/DRESSINGS) ×3
BOWL SMART MIX CTS (DISPOSABLE) ×3 IMPLANT
CAPT KNEE TOTAL 3 ×3 IMPLANT
CEMENT BONE SIMPLEX SPEEDSET (Cement) ×6 IMPLANT
CLOSURE WOUND 1/2 X4 (GAUZE/BANDAGES/DRESSINGS) ×1
COVER SURGICAL LIGHT HANDLE (MISCELLANEOUS) ×3 IMPLANT
CUFF TOURNIQUET SINGLE 34IN LL (TOURNIQUET CUFF) ×3 IMPLANT
DRAPE EXTREMITY T 121X128X90 (DRAPE) ×3 IMPLANT
DRAPE IMP U-DRAPE 54X76 (DRAPES) ×3 IMPLANT
DRAPE PROXIMA HALF (DRAPES) ×3 IMPLANT
DRAPE U-SHAPE 47X51 STRL (DRAPES) ×3 IMPLANT
DRSG PAD ABDOMINAL 8X10 ST (GAUZE/BANDAGES/DRESSINGS) ×3 IMPLANT
DURAPREP 26ML APPLICATOR (WOUND CARE) ×3 IMPLANT
ELECT CAUTERY BLADE 6.4 (BLADE) ×3 IMPLANT
ELECT REM PT RETURN 9FT ADLT (ELECTROSURGICAL) ×3
ELECTRODE REM PT RTRN 9FT ADLT (ELECTROSURGICAL) ×1 IMPLANT
EVACUATOR 1/8 PVC DRAIN (DRAIN) ×3 IMPLANT
FACESHIELD WRAPAROUND (MASK) ×6 IMPLANT
GAUZE SPONGE 4X4 12PLY STRL (GAUZE/BANDAGES/DRESSINGS) ×3 IMPLANT
GLOVE BIOGEL PI IND STRL 7.0 (GLOVE) ×4 IMPLANT
GLOVE BIOGEL PI INDICATOR 7.0 (GLOVE) ×8
GLOVE ECLIPSE 7.0 STRL STRAW (GLOVE) ×6 IMPLANT
GLOVE ORTHO TXT STRL SZ7.5 (GLOVE) ×6 IMPLANT
GLOVE SURG SS PI 8.0 STRL IVOR (GLOVE) ×6 IMPLANT
GOWN STRL REUS W/ TWL LRG LVL3 (GOWN DISPOSABLE) ×4 IMPLANT
GOWN STRL REUS W/ TWL XL LVL3 (GOWN DISPOSABLE) ×1 IMPLANT
GOWN STRL REUS W/TWL 2XL LVL3 (GOWN DISPOSABLE) ×3 IMPLANT
GOWN STRL REUS W/TWL LRG LVL3 (GOWN DISPOSABLE) ×8
GOWN STRL REUS W/TWL XL LVL3 (GOWN DISPOSABLE) ×2
HANDPIECE INTERPULSE COAX TIP (DISPOSABLE) ×2
IMMOBILIZER KNEE 22 UNIV (SOFTGOODS) ×3 IMPLANT
IMMOBILIZER KNEE 24 THIGH 36 (MISCELLANEOUS) IMPLANT
IMMOBILIZER KNEE 24 UNIV (MISCELLANEOUS)
KIT BASIN OR (CUSTOM PROCEDURE TRAY) ×3 IMPLANT
KIT ROOM TURNOVER OR (KITS) ×3 IMPLANT
MANIFOLD NEPTUNE II (INSTRUMENTS) ×3 IMPLANT
NEEDLE 18GX1X1/2 (RX/OR ONLY) (NEEDLE) ×3 IMPLANT
NEEDLE HYPO 25GX1X1/2 BEV (NEEDLE) ×3 IMPLANT
NS IRRIG 1000ML POUR BTL (IV SOLUTION) ×3 IMPLANT
PACK TOTAL JOINT (CUSTOM PROCEDURE TRAY) ×3 IMPLANT
PACK UNIVERSAL I (CUSTOM PROCEDURE TRAY) ×3 IMPLANT
PAD ARMBOARD 7.5X6 YLW CONV (MISCELLANEOUS) ×3 IMPLANT
PAD CAST 4YDX4 CTTN HI CHSV (CAST SUPPLIES) IMPLANT
PADDING CAST ABS 6INX4YD NS (CAST SUPPLIES) ×2
PADDING CAST ABS COTTON 6X4 NS (CAST SUPPLIES) ×1 IMPLANT
PADDING CAST COTTON 4X4 STRL (CAST SUPPLIES)
PADDING CAST COTTON 6X4 STRL (CAST SUPPLIES) ×3 IMPLANT
SET HNDPC FAN SPRY TIP SCT (DISPOSABLE) ×1 IMPLANT
STRIP CLOSURE SKIN 1/2X4 (GAUZE/BANDAGES/DRESSINGS) ×2 IMPLANT
SUCTION FRAZIER TIP 10 FR DISP (SUCTIONS) ×3 IMPLANT
SUT MNCRL AB 4-0 PS2 18 (SUTURE) ×3 IMPLANT
SUT VIC AB 0 CT1 27 (SUTURE) ×2
SUT VIC AB 0 CT1 27XBRD ANBCTR (SUTURE) ×1 IMPLANT
SUT VIC AB 1 CT1 27 (SUTURE) ×4
SUT VIC AB 1 CT1 27XBRD ANBCTR (SUTURE) ×2 IMPLANT
SUT VIC AB 2-0 CT1 27 (SUTURE) ×4
SUT VIC AB 2-0 CT1 TAPERPNT 27 (SUTURE) ×2 IMPLANT
SYR 50ML LL SCALE MARK (SYRINGE) ×3 IMPLANT
SYR CONTROL 10ML LL (SYRINGE) ×3 IMPLANT
TOWEL OR 17X24 6PK STRL BLUE (TOWEL DISPOSABLE) ×3 IMPLANT
TOWEL OR 17X26 10 PK STRL BLUE (TOWEL DISPOSABLE) ×3 IMPLANT
TRAY CATH 16FR W/PLASTIC CATH (SET/KITS/TRAYS/PACK) ×3 IMPLANT
WATER STERILE IRR 1000ML POUR (IV SOLUTION) ×3 IMPLANT
YANKAUER SUCT BULB TIP NO VENT (SUCTIONS) ×3 IMPLANT

## 2014-12-30 NOTE — H&P (View-Only) (Signed)
TOTAL KNEE ADMISSION H&P  Patient is being admitted for right total knee arthroplasty.  Subjective:  Chief Complaint:right knee pain.  HPI: Barbara Dyer, 49 y.o. female, has a history of pain and functional disability in the right knee due to arthritis and has failed non-surgical conservative treatments for greater than 12 weeks to includeNSAID's and/or analgesics, corticosteriod injections and use of assistive devices.  Onset of symptoms was gradual, starting >10 years ago with rapidlly worsening course since that time. The patient noted prior procedures on the knee to include  arthroscopy and menisectomy on the right knee(s).  Patient currently rates pain in the right knee(s) at 7 out of 10 with activity. Patient has night pain, worsening of pain with activity and weight bearing and crepitus.  Patient has evidence of subchondral sclerosis and joint space narrowing by imaging studies. There is no active infection.  There are no active problems to display for this patient.  No past medical history on file.  No past surgical history on file.   (Not in a hospital admission) Allergies  Allergen Reactions  . Morphine And Related Hives and Other (See Comments)    Tachycardia   . Prednisone Hives and Other (See Comments)    Tachycardia (with PO steroids; has never had steroid injections)   . Humulin [Insulin Nph Isophane & Regular] Hives and Rash  . Tape Dermatitis    History  Substance Use Topics  . Smoking status: Not on file  . Smokeless tobacco: Not on file  . Alcohol Use: Not on file    No family history on file.   Review of Systems  Constitutional: Negative.   HENT: Negative.   Eyes: Negative.   Respiratory: Negative.   Cardiovascular: Negative.   Gastrointestinal: Negative.   Genitourinary: Positive for frequency. Negative for dysuria, urgency and hematuria.  Musculoskeletal: Positive for joint pain.  Skin: Negative.   Neurological: Negative.   Endo/Heme/Allergies:  Negative.   Psychiatric/Behavioral: Negative.     Objective:  Physical Exam  Constitutional: She is oriented to person, place, and time. She appears well-developed and well-nourished.  HENT:  Head: Normocephalic and atraumatic.  Eyes: EOM are normal. Pupils are equal, round, and reactive to light.  Neck: Normal range of motion. Neck supple.  Cardiovascular: Normal rate and regular rhythm.  Exam reveals no gallop and no friction rub.   No murmur heard. Respiratory: Effort normal and breath sounds normal. No respiratory distress. She has no wheezes. She has no rales.  GI: Soft. Bowel sounds are normal. She exhibits no distension. There is no tenderness.  Musculoskeletal:  Markedly antalgic gait.  Utilizes a cane. Negative log roll both hips, no trochanteric tenderness.  Both knees have reasonable alignment.  She can still kind of full extension, and better than 100 degrees of flexion.  Relatively  profound patellofemoral crepitus, right greater than left.  Marked pain with patellar compression.  A little tender medial and lateral joint line, both knees.   Neurological: She is alert and oriented to person, place, and time.  Skin: Skin is warm and dry.  Psychiatric: She has a normal mood and affect. Her behavior is normal. Judgment and thought content normal.    Vital signs in last 24 hours: @VSRANGES @  Labs:   There is no height or weight on file to calculate BMI.   Imaging Review Plain radiographs demonstrate severe degenerative joint disease of the right knee(s). The overall alignment isneutral. The bone quality appears to be fair for age and reported activity  level.  Assessment/Plan:  End stage arthritis, right knee   The patient history, physical examination, clinical judgment of the provider and imaging studies are consistent with end stage degenerative joint disease of the right knee(s) and total knee arthroplasty is deemed medically necessary. The treatment options  including medical management, injection therapy arthroscopy and arthroplasty were discussed at length. The risks and benefits of total knee arthroplasty were presented and reviewed. The risks due to aseptic loosening, infection, stiffness, patella tracking problems, thromboembolic complications and other imponderables were discussed. The patient acknowledged the explanation, agreed to proceed with the plan and consent was signed. Patient is being admitted for inpatient treatment for surgery, pain control, PT, OT, prophylactic antibiotics, VTE prophylaxis, progressive ambulation and ADL's and discharge planning. The patient is planning to be discharged home with home health services

## 2014-12-30 NOTE — Anesthesia Procedure Notes (Signed)
Spinal  Start time: 12/30/2014 10:20 AM End time: 12/30/2014 10:25 AM Preanesthetic Checklist Completed: patient identified, site marked, surgical consent, pre-op evaluation, timeout performed, IV checked, risks and benefits discussed and monitors and equipment checked Spinal Block Patient position: right lateral decubitus Prep: Betadine and site prepped and draped Patient monitoring: heart rate, cardiac monitor, continuous pulse ox and blood pressure Approach: midline Location: L3-4 Injection technique: single-shot Needle Needle type: Tuohy  Needle gauge: 22 G Needle length: 9 cm Additional Notes 10 mg 0.5% marcaine 1:200 epi injected easily

## 2014-12-30 NOTE — Anesthesia Preprocedure Evaluation (Signed)
Anesthesia Evaluation  Patient identified by MRN, date of birth, ID band Patient awake    Reviewed: Allergy & Precautions, NPO status , Patient's Chart, lab work & pertinent test results  History of Anesthesia Complications Negative for: history of anesthetic complications  Airway Mallampati: II  TM Distance: >3 FB Neck ROM: Full    Dental  (+) Teeth Intact, Dental Advisory Given   Pulmonary Current Smoker,    Pulmonary exam normal       Cardiovascular negative cardio ROS      Neuro/Psych negative neurological ROS  negative psych ROS   GI/Hepatic negative GI ROS, Neg liver ROS,   Endo/Other  diabetes  Renal/GU negative Renal ROS     Musculoskeletal   Abdominal   Peds  Hematology   Anesthesia Other Findings   Reproductive/Obstetrics                             Anesthesia Physical Anesthesia Plan  ASA: III  Anesthesia Plan: MAC and Spinal   Post-op Pain Management:    Induction:   Airway Management Planned: Simple Face Mask  Additional Equipment:   Intra-op Plan:   Post-operative Plan:   Informed Consent: I have reviewed the patients History and Physical, chart, labs and discussed the procedure including the risks, benefits and alternatives for the proposed anesthesia with the patient or authorized representative who has indicated his/her understanding and acceptance.   Dental advisory given  Plan Discussed with: CRNA, Anesthesiologist and Surgeon  Anesthesia Plan Comments:         Anesthesia Quick Evaluation

## 2014-12-30 NOTE — Op Note (Signed)
Barbara Dyer, [name] NO.:  [id number]  MEDICAL RECORD NO.:  [id number]  LOCATION:  5N13C                        FACILITY:  [facility]  PHYSICIAN:  Ninetta Lights, M.D. DATE OF BIRTH:  15-Mar-1966  DATE OF PROCEDURE:  12/30/2014 DATE OF DISCHARGE:                              OPERATIVE REPORT   PREOPERATIVE DIAGNOSIS:  Right knee primary localized end-stage degenerative arthritis, most marked patellofemoral joint.  POSTOPERATIVE DIAGNOSIS:  Right knee primary localized end-stage degenerative arthritis, most marked patellofemoral joint.  PROCEDURE:  Right knee modified minimally invasive total knee replacement Stryker triathlon prosthesis.  Soft tissue balancing.  A cemented pegged cruciate retaining #2 femoral component.  Cemented #2 tibial component, 9 mm CS insert.  Cemented resurfacing 32-mm patellar component.  SURGEON:  Ninetta Lights, M.D.  ASSISTANT:  Elmyra Ricks, PA, present throughout the entire case and necessary for timely completion of procedure.  ANESTHESIA:  Spinal.  BLOOD LOSS:  Minimal.  SPECIMENS:  None.  CULTURES:  None.  COMPLICATIONS:  None.  DRESSINGS:  Soft compressive knee immobilizer.  TOURNIQUET TIME:  45 minutes.  DRAINS:  Hemovac x1.  DESCRIPTION OF PROCEDURE:  The patient was brought to operating room, placed on the operating table in supine position.  After adequate anesthesia had been obtained, tourniquet applied.  Prepped and draped in usual sterile fashion.  Exsanguinated with elevation of Esmarch, tourniquet inflated to 350 mmHg.  Straight incision above the patella down to tibial tubercle.  Medial arthrotomy, vastus splitting, preserving quad tendon.  8 mm resection, distal femur with flexible intramedullary guide.  Using epicondylar axis, the femur was sized, cut, and fitted for a pegged #2 cruciate retaining component.  Proximal tibial resection with extramedullary guide.  Sized with #2  component. Nicely balanced in flexion extension.  Patella exposed.  Markedly eroded.  Brought back to an even bed, resected as little bone as possible to achieve that because it was very thin.  Drilled, sized, and fitted for a 32-mm component.  With trials in place using a 9 mm CS insert , very pleased of motion stability  balancing and patellar tracking.  Tibia was marked for rotation and reamed.  All trials removed.  Copious irrigation with a pulse irrigating device.  Cement prepared and placed on all components.  Firmly seated.  Polyethylene attached to tibia, knee reduced.  Patella held with a clamp.  Once cement hardened, the knee was irrigated again.  Hemovac placed. Injected with Exparel.  Arthrotomy closed with Ethibond.  Subcutaneous and subcuticular closure of the wound.  Margins were injected with Marcaine.  Sterile compressive dressing applied.  Tourniquet deflated and removed.  Knee immobilizer applied.  Anesthesia reversed.  Brought to the recovery room.  Tolerated the surgery well.  No complications.     Ninetta Lights, M.D.     DFM/MEDQ  D:  12/30/2014  T:  12/30/2014  Job:  [id number]

## 2014-12-30 NOTE — Interval H&P Note (Signed)
History and Physical Interval Note:  12/30/2014 8:32 AM  Barbara Dyer  has presented today for surgery, with the diagnosis of djd right knee  The various methods of treatment have been discussed with the patient and family. After consideration of risks, benefits and other options for treatment, the patient has consented to  Procedure(s): TOTAL KNEE ARTHROPLASTY (Right) as a surgical intervention .  The patient's history has been reviewed, patient examined, no change in status, stable for surgery.  I have reviewed the patient's chart and labs.  Questions were answered to the patient's satisfaction.     Sinclaire Artiga F

## 2014-12-30 NOTE — Discharge Summary (Signed)
Patient ID: Barbara Dyer MRN: [id number] DOB/AGE: 03/20/1966 49 y.o.  Admit date: 12/30/2014 Discharge date: 12/31/2014  Admission Diagnoses:  Active Problems:   DJD (degenerative joint disease) of knee   Discharge Diagnoses:  Same  Past Medical History  Diagnosis Date  . Diabetes mellitus without complication   . Arthritis     Surgeries: Procedure(s): TOTAL KNEE ARTHROPLASTY on 12/30/2014   Consultants:    Discharged Condition: Improved  Hospital Course: Barbara Dyer is an 49 y.o. female who was admitted 12/30/2014 for operative treatment of primary localized osteoarthritis right knee. Patient has severe unremitting pain that affects sleep, daily activities, and work/hobbies. After pre-op clearance the patient was taken to the operating room on 12/30/2014 and underwent  Procedure(s): TOTAL KNEE ARTHROPLASTY.    Patient was given perioperative antibiotics:      Anti-infectives    Start     Dose/Rate Route Frequency Ordered Stop   12/30/14 1800  ceFAZolin (ANCEF) IVPB 2 g/50 mL premix     2 g 100 mL/hr over 30 Minutes Intravenous Every 6 hours 12/30/14 1705 12/31/14 0225   12/30/14 0600  ceFAZolin (ANCEF) IVPB 2 g/50 mL premix     2 g 100 mL/hr over 30 Minutes Intravenous On call to O.R. 12/29/14 1355 12/30/14 1038       Patient was given sequential compression devices, early ambulation, and chemoprophylaxis to prevent DVT.  Patient benefited maximally from hospital stay and there were no complications.    Recent vital signs:  Patient Vitals for the past 24 hrs:  BP Temp Temp src Pulse Resp SpO2 Height Weight  12/31/14 0519 (!) 158/93 mmHg 98.2 F (36.8 C) - 99 18 97 % - -  12/31/14 0400 - - - - 18 95 % - -  12/31/14 0202 (!) 162/117 mmHg 98.1 F (36.7 C) - 100 18 95 % - -  12/31/14 0000 - - - - 18 94 % - -  12/30/14 2038 (!) 172/89 mmHg 98.6 F (37 C) - 92 18 94 % - -  12/30/14 2000 - - - - 18 94 % - -  12/30/14 1607 129/80 mmHg 97.5 F (36.4 C) Oral 87 18 99 %  - -  12/30/14 1544 128/82 mmHg 97.7 F (36.5 C) - 87 14 99 % - -  12/30/14 1530 (!) 104/53 mmHg - - 87 14 98 % - -  12/30/14 1515 109/69 mmHg - - 93 18 90 % - -  12/30/14 1500 - - - 87 15 92 % - -  12/30/14 1445 98/62 mmHg - - 83 17 92 % - -  12/30/14 1430 - - - 87 19 93 % - -  12/30/14 1415 111/74 mmHg - - 88 (!) 21 93 % - -  12/30/14 1400 124/72 mmHg 97.8 F (36.6 C) - 83 12 95 % - -  12/30/14 1359 (!) 88/63 mmHg - - 86 17 95 % - -  12/30/14 1345 (!) 103/53 mmHg - - 79 13 96 % - -  12/30/14 1330 - - - 77 17 90 % - -  12/30/14 1315 (!) 89/56 mmHg - - 78 17 92 % - -  12/30/14 1300 (!) 98/57 mmHg - - 74 20 91 % - -  12/30/14 1245 100/60 mmHg - - 72 19 92 % - -  12/30/14 1230 122/71 mmHg - - 79 13 96 % - -  12/30/14 1215 122/74 mmHg - - 76 17 96 % - -  12/30/14 1200 123/76 mmHg - -  83 16 95 % - -  12/30/14 1150 125/74 mmHg 97.9 F (36.6 C) - 80 17 100 % - -  12/30/14 0830 121/66 mmHg 98.5 F (36.9 C) Oral 99 20 95 % 5' (1.524 m) 85.73 kg (189 lb)     Recent laboratory studies: No results for input(s): WBC, HGB, HCT, PLT, NA, K, CL, CO2, BUN, CREATININE, GLUCOSE, INR, CALCIUM in the last 72 hours.  Invalid input(s): PT, 2   Discharge Medications:     Medication List    STOP taking these medications        acetaminophen 500 MG tablet  Commonly known as:  TYLENOL      TAKE these medications        apixaban 2.5 MG Tabs tablet  Commonly known as:  ELIQUIS  Take 1 tab po q 12 hours x 12 days following surgery to prevent blood clots     atorvastatin 20 MG tablet  Commonly known as:  LIPITOR  Take 20 mg by mouth daily.     bisacodyl 5 MG EC tablet  Commonly known as:  DULCOLAX  Take 1 tablet (5 mg total) by mouth daily as needed for moderate constipation.     BLACK COHOSH PO  Take 1 capsule by mouth 2 (two) times daily.     calcium carbonate 500 MG chewable tablet  Commonly known as:  TUMS - dosed in mg elemental calcium  Chew 8 tablets by mouth daily.      cetirizine 10 MG tablet  Commonly known as:  ZYRTEC  Take 10 mg by mouth daily.     FLAX SEED OIL PO  Take 1 capsule by mouth daily.     GNP GINGKO BILOBA EXTRACT PO  Take 1 capsule by mouth daily.     metFORMIN 500 MG 24 hr tablet  Commonly known as:  GLUCOPHAGE-XR  Take 500 mg by mouth daily.     methocarbamol 500 MG tablet  Commonly known as:  ROBAXIN  Take 1 tablet (500 mg total) by mouth 4 (four) times daily.     multivitamin tablet  Take 2 tablets by mouth daily.     ondansetron 4 MG tablet  Commonly known as:  ZOFRAN  Take 1 tablet (4 mg total) by mouth every 8 (eight) hours as needed for nausea or vomiting.     OVER THE COUNTER MEDICATION  Take 1 capsule by mouth daily. "Digestive Advantage"     OVER THE COUNTER MEDICATION  Nerve tonic     oxyCODONE-acetaminophen 5-325 MG per tablet  Commonly known as:  ROXICET  Take 1-2 tablets by mouth every 4 (four) hours as needed.     PAPAYA AND ENZYMES PO  Take 1 capsule by mouth 4 (four) times daily.     polycarbophil 625 MG tablet  Commonly known as:  FIBERCON  Take 1,250 mg by mouth daily.     SUDAFED 24 HOUR PO  Take 1 tablet by mouth 4 (four) times daily as needed (for allergies).        Diagnostic Studies: Dg Knee Right Port  12/30/2014   CLINICAL DATA:  Post total knee replacement.  EXAM: PORTABLE RIGHT KNEE - 1-2 VIEW  COMPARISON:  None.  FINDINGS: Changes of right total knee replacement. No hardware or bony complicating feature. Soft tissue and joint space gas. Soft tissue drain in place.  IMPRESSION: Right knee replacement without complicating feature.   Electronically Signed   By: Rolm Baptise M.D.   On: 12/30/2014  12:36    Disposition:     Follow-up Information    Follow up with [facility] F, MD. Schedule an appointment as soon as possible for a visit in 2 weeks.   Specialty:  Orthopedic Surgery   Contact information:   [address] [place] [address] [phone]         Signed: Fannie Knee 12/31/2014, 6:43 AM

## 2014-12-30 NOTE — Transfer of Care (Signed)
Immediate Anesthesia Transfer of Care Note  Patient: Barbara Dyer  Procedure(s) Performed: Procedure(s): TOTAL KNEE ARTHROPLASTY (Right)  Patient Location: PACU  Anesthesia Type:MAC and Regional  Level of Consciousness: awake, alert  and oriented  Airway & Oxygen Therapy: Patient Spontanous Breathing and Patient connected to face mask oxygen  Post-op Assessment: Report given to RN and Post -op Vital signs reviewed and stable  Post vital signs: Reviewed and stable  Last Vitals:  Filed Vitals:   12/30/14 0830  BP: 121/66  Pulse: 99  Temp: 36.9 C  Resp: 20    Complications: No apparent anesthesia complications

## 2014-12-30 NOTE — Progress Notes (Signed)
Orthopedic Tech Progress Note Patient Details:  Barbara Dyer 10/29/1965 [id number]  CPM Right Knee CPM Right Knee: On Right Knee Flexion (Degrees): 90 Right Knee Extension (Degrees): 0 Additional Comments: trapeze bar patient helper Viewed order from doctor's order list  Hildred Priest 12/30/2014, 12:21 PM

## 2014-12-30 NOTE — Progress Notes (Signed)
Utilization review completed.  

## 2014-12-31 ENCOUNTER — Encounter (HOSPITAL_COMMUNITY): Payer: Self-pay | Admitting: General Practice

## 2014-12-31 LAB — BASIC METABOLIC PANEL
Anion gap: 10 (ref 5–15)
BUN: <5 mg/dL — ABNORMAL LOW (ref 6–23)
CO2: 27 mmol/L (ref 19–32)
CREATININE: 0.48 mg/dL — AB (ref 0.50–1.10)
Calcium: 8.8 mg/dL (ref 8.4–10.5)
Chloride: 99 mmol/L (ref 96–112)
GFR calc Af Amer: >90 mL/min (ref >90)
GFR calc non Af Amer: >90 mL/min (ref >90)
GLUCOSE: 170 mg/dL — AB (ref 70–99)
Potassium: 3.7 mmol/L (ref 3.5–5.1)
SODIUM: 136 mmol/L (ref 135–145)

## 2014-12-31 LAB — CBC
HEMATOCRIT: 37.7 % (ref 36.0–46.0)
HEMOGLOBIN: 13.1 g/dL (ref 12.0–15.0)
MCH: 32.2 pg (ref 26.0–34.0)
MCHC: 34.7 g/dL (ref 30.0–36.0)
MCV: 92.6 fL (ref 78.0–100.0)
PLATELETS: 213 10*3/uL (ref 150–400)
RBC: 4.07 MIL/uL (ref 3.87–5.11)
RDW: 12.3 % (ref 11.5–15.5)
WBC: 9.9 10*3/uL (ref 4.0–10.5)

## 2014-12-31 LAB — GLUCOSE, CAPILLARY
GLUCOSE-CAPILLARY: 176 mg/dL — AB (ref 70–99)
GLUCOSE-CAPILLARY: 158 mg/dL — AB (ref 70–99)
GLUCOSE-CAPILLARY: 174 mg/dL — AB (ref 70–99)
Glucose-Capillary: 144 mg/dL — ABNORMAL HIGH (ref 70–99)
Glucose-Capillary: 182 mg/dL — ABNORMAL HIGH (ref 70–99)

## 2014-12-31 MED ORDER — WHITE PETROLATUM GEL
Status: AC
  Administered 2014-12-31: 01:00:00
  Filled 2014-12-31: qty 1

## 2014-12-31 MED ORDER — CELECOXIB 200 MG PO CAPS
200.0000 mg | ORAL_CAPSULE | Freq: Two times a day (BID) | ORAL | Status: DC
  Administered 2014-12-31 – 2015-01-01 (×3): 200 mg via ORAL
  Filled 2014-12-31 (×3): qty 1

## 2014-12-31 NOTE — Progress Notes (Signed)
Spoke with Ria Comment PA will let pt be discharged tomorrow after she does the stairs

## 2014-12-31 NOTE — Evaluation (Signed)
Physical Therapy Evaluation Patient Details Name: Barbara Dyer MRN: [id number] DOB: 1966-02-03 Today's Date: 12/31/2014   History of Present Illness  Patient is a 49 y/o female s/p right TKA.  Clinical Impression  Patient presents with decreased independence with mobility due to deficits listed in PT problem list.  She will benefit from skilled PT in the acute setting to allow return home with HHPT and spouse assist.    Follow Up Recommendations Home health PT;Supervision/Assistance - 24 hour    Equipment Recommendations  None recommended by PT    Recommendations for Other Services       Precautions / Restrictions Precautions Precautions: Fall;Knee Precaution Booklet Issued: No Required Braces or Orthoses: Knee Immobilizer - Right Knee Immobilizer - Right: On when out of bed or walking Restrictions Weight Bearing Restrictions: Yes LLE Weight Bearing: Weight bearing as tolerated      Mobility  Bed Mobility Overal bed mobility: Needs Assistance Bed Mobility: Supine to Sit     Supine to sit: Min assist     General bed mobility comments: for right LE, use of rail  Transfers Overall transfer level: Needs assistance Equipment used: Rolling walker (2 wheeled) Transfers: Sit to/from Stand Sit to Stand: Min assist         General transfer comment: cues for hand placement and LE management  Ambulation/Gait Ambulation/Gait assistance: Min assist Ambulation Distance (Feet): 40 Feet (and 10') Assistive device: Rolling walker (2 wheeled) Gait Pattern/deviations: Step-to pattern;Antalgic;Decreased stride length;Shuffle     General Gait Details: initially was not putting right foot flat or at times on floor at all; cues for at least toes, then with increased ambulation pt able to put foot flat, still heavily reiles on RW  Stairs            Wheelchair Mobility    Modified Rankin (Stroke Patients Only)       Balance Overall balance assessment: Needs  assistance         Standing balance support: During functional activity;No upper extremity supported Standing balance-Leahy Scale: Fair Standing balance comment: stands at sink to wash hands without UE support                              Pertinent Vitals/Pain Pain Assessment: 0-10 Pain Score: 8  Pain Intervention(s): Premedicated before session;Monitored during session;Repositioned;Ice applied    Home Living Family/patient expects to be discharged to:: Private residence Living Arrangements: Spouse/significant other Available Help at Discharge: Family;Available 24 hours/day Type of Home: Mobile home Home Access: Stairs to enter Entrance Stairs-Rails: Right;Left Entrance Stairs-Number of Steps: 4 Home Layout: One level Home Equipment: Walker - 2 wheels;Bedside commode      Prior Function Level of Independence: Independent               Hand Dominance        Extremity/Trunk Assessment   Upper Extremity Assessment: Overall WFL for tasks assessed           Lower Extremity Assessment: RLE deficits/detail RLE Deficits / Details: ankle AROM WFL, knee flexion approx 25-30 degrees with assist limited by pain, positive quad activation, but assist needed to lift antigravity       Communication   Communication: No difficulties  Cognition Arousal/Alertness: Awake/alert Behavior During Therapy: Anxious Overall Cognitive Status: Within Functional Limits for tasks assessed                      General  Comments      Exercises Total Joint Exercises Ankle Circles/Pumps: AROM;10 reps;Supine;Both Quad Sets: AROM;Both;5 reps Short Arc Quad: AAROM;Right;5 reps;Seated Heel Slides: AAROM;Right;5 reps;Seated Hip ABduction/ADduction: AROM;Right;5 reps;Seated Straight Leg Raises: AAROM;Right;5 reps;Seated      Assessment/Plan    PT Assessment Patient needs continued PT services  PT Diagnosis Difficulty walking;Acute pain   PT Problem List  Decreased mobility;Decreased strength;Decreased balance;Decreased range of motion;Decreased activity tolerance;Pain;Decreased knowledge of use of DME  PT Treatment Interventions Functional mobility training;Therapeutic activities;Patient/family education;Stair training;Gait training;Balance training;DME instruction;Therapeutic exercise   PT Goals (Current goals can be found in the Care Plan section) Acute Rehab PT Goals Patient Stated Goal: To go home PT Goal Formulation: With patient Time For Goal Achievement: 01/05/15 Potential to Achieve Goals: Good    Frequency 7X/week   Barriers to discharge        Co-evaluation               End of Session Equipment Utilized During Treatment: Gait belt;Right knee immobilizer Activity Tolerance: Patient limited by pain Patient left: in chair;with call bell/phone within reach           Time: 0905-0932 PT Time Calculation (min) (ACUTE ONLY): 27 min   Charges:   PT Evaluation $Initial PT Evaluation Tier I: 1 Procedure PT Treatments $Gait Training: 8-22 mins   PT G Codes:        Rusti Arizmendi,CYNDI 01-20-15, 10:20 AM  Magda Kiel, PT [phone] 01/20/2015

## 2014-12-31 NOTE — Discharge Instructions (Signed)
INSTRUCTIONS AFTER JOINT REPLACEMENT   o Remove items at home which could result in a fall. This includes throw rugs or furniture in walking pathways o ICE to the affected joint every three hours while awake for 30 minutes at a time, for at least the first 3-5 days, and then as needed for pain and swelling.  Continue to use ice for pain and swelling. You may notice swelling that will progress down to the foot and ankle.  This is normal after surgery.  Elevate your leg when you are not up walking on it.   o Continue to use the breathing machine you got in the hospital (incentive spirometer) which will help keep your temperature down.  It is common for your temperature to cycle up and down following surgery, especially at night when you are not up moving around and exerting yourself.  The breathing machine keeps your lungs expanded and your temperature down.  BLOOD THINNER: TAKE ELIQUIS AS DIRECTED FOR A TOTAL OF 12 DAYS FOLLOWING SURGERY TO PREVENT BLOOD CLOTS  DIET:  As you were doing prior to hospitalization, we recommend a well-balanced diet.  DRESSING / WOUND CARE / SHOWERING  You may change your dressing 3-5 days after surgery.  Then change the dressing every day with sterile gauze.  Please use good hand washing techniques before changing the dressing.  Do not use any lotions or creams on the incision until instructed by your surgeon. and You may shower 3 days after surgery, but keep the wounds dry during showering.  You may use an occlusive plastic wrap (Press'n Seal for example), NO SOAKING/SUBMERGING IN THE BATHTUB.  If the bandage gets wet, change with a clean dry gauze.  If the incision gets wet, pat the wound dry with a clean towel.  ACTIVITY  o Increase activity slowly as tolerated, but follow the weight bearing instructions below.   o No driving for 6 weeks or until further direction given by your physician.  You cannot drive while taking narcotics.  o No lifting or carrying greater  than 10 lbs. until further directed by your surgeon. o Avoid periods of inactivity such as sitting longer than an hour when not asleep. This helps prevent blood clots.  o You may return to work once you are authorized by your doctor.     WEIGHT BEARING   Weight bearing as tolerated with assist device (walker, cane, etc) as directed, use it as long as suggested by your surgeon or therapist, typically at least 4-6 weeks.   EXERCISES  Results after joint replacement surgery are often greatly improved when you follow the exercise, range of motion and muscle strengthening exercises prescribed by your doctor. Safety measures are also important to protect the joint from further injury. Any time any of these exercises cause you to have increased pain or swelling, decrease what you are doing until you are comfortable again and then slowly increase them. If you have problems or questions, call your caregiver or physical therapist for advice.   Rehabilitation is important following a joint replacement. After just a few days of immobilization, the muscles of the leg can become weakened and shrink (atrophy).  These exercises are designed to build up the tone and strength of the thigh and leg muscles and to improve motion. Often times heat used for twenty to thirty minutes before working out will loosen up your tissues and help with improving the range of motion but do not use heat for the first two weeks  following surgery (sometimes heat can increase post-operative swelling).   These exercises can be done on a training (exercise) mat, on the floor, on a table or on a bed. Use whatever works the best and is most comfortable for you.    Use music or television while you are exercising so that the exercises are a pleasant break in your day. This will make your life better with the exercises acting as a break in your routine that you can look forward to.   Perform all exercises about fifteen times, three times per  day or as directed.  You should exercise both the operative leg and the other leg as well.  Exercises include:    Quad Sets - Tighten up the muscle on the front of the thigh (Quad) and hold for 5-10 seconds.    Straight Leg Raises - With your knee straight (if you were given a brace, keep it on), lift the leg to 60 degrees, hold for 3 seconds, and slowly lower the leg.  Perform this exercise against resistance later as your leg gets stronger.   Leg Slides: Lying on your back, slowly slide your foot toward your buttocks, bending your knee up off the floor (only go as far as is comfortable). Then slowly slide your foot back down until your leg is flat on the floor again.   Angel Wings: Lying on your back spread your legs to the side as far apart as you can without causing discomfort.   Hamstring Strength:  Lying on your back, push your heel against the floor with your leg straight by tightening up the muscles of your buttocks.  Repeat, but this time bend your knee to a comfortable angle, and push your heel against the floor.  You may put a pillow under the heel to make it more comfortable if necessary.   A rehabilitation program following joint replacement surgery can speed recovery and prevent re-injury in the future due to weakened muscles. Contact your doctor or a physical therapist for more information on knee rehabilitation.    CONSTIPATION  Constipation is defined medically as fewer than three stools per week and severe constipation as less than one stool per week.  Even if you have a regular bowel pattern at home, your normal regimen is likely to be disrupted due to multiple reasons following surgery.  Combination of anesthesia, postoperative narcotics, change in appetite and fluid intake all can affect your bowels.   YOU MUST use at least one of the following options; they are listed in order of increasing strength to get the job done.  They are all available over the counter, and you may  need to use some, POSSIBLY even all of these options:    Drink plenty of fluids (prune juice may be helpful) and high fiber foods Colace 100 mg by mouth twice a day  Senokot for constipation as directed and as needed Dulcolax (bisacodyl), take with full glass of water  Miralax (polyethylene glycol) once or twice a day as needed.  If you have tried all these things and are unable to have a bowel movement in the first 3-4 days after surgery call either your surgeon or your primary doctor.    If you experience loose stools or diarrhea, hold the medications until you stool forms back up.  If your symptoms do not get better within 1 week or if they get worse, check with your doctor.  If you experience "the worst abdominal pain ever" or develop  nausea or vomiting, please contact the office immediately for further recommendations for treatment.   ITCHING:  If you experience itching with your medications, try taking only a single pain pill, or even half a pain pill at a time.  You can also use Benadryl over the counter for itching or also to help with sleep.   TED HOSE STOCKINGS:  Use stockings on both legs until for at least 2 weeks or as directed by physician office. They may be removed at night for sleeping.  MEDICATIONS:  See your medication summary on the After Visit Summary that nursing will review with you.  You may have some home medications which will be placed on hold until you complete the course of blood thinner medication.  It is important for you to complete the blood thinner medication as prescribed.  PRECAUTIONS:  If you experience chest pain or shortness of breath - call 911 immediately for transfer to the hospital emergency department.   If you develop a fever greater that 101 F, purulent drainage from wound, increased redness or drainage from wound, foul odor from the wound/dressing, or calf pain - CONTACT YOUR SURGEON.                                                   FOLLOW-UP  APPOINTMENTS:  If you do not already have a post-op appointment, please call the office for an appointment to be seen by your surgeon.  Guidelines for how soon to be seen are listed in your After Visit Summary, but are typically between 1-4 weeks after surgery.  OTHER INSTRUCTIONS:   Knee Replacement:  Do not place pillow under knee, focus on keeping the knee straight while resting. CPM instructions: 0-90 degrees, 2 hours in the morning, 2 hours in the afternoon, and 2 hours in the evening. Place foam block, curve side up under heel at all times except when in CPM or when walking.  DO NOT modify, tear, cut, or change the foam block in any way.  MAKE SURE YOU:   Understand these instructions.   Get help right away if you are not doing well or get worse.    Thank you for letting us be a part of your medical care team.  It is a privilege we respect greatly.  We hope these instructions will help you stay on track for a fast and full recovery!   ----------------------------  Information on my medicine - ELIQUIS (apixaban)  This medication education was reviewed with me or my healthcare representative as part of my discharge preparation.  The pharmacist that spoke with me during my hospital stay was:  Arty Baumgartner, [facility]  Why was Eliquis prescribed for you? Eliquis was prescribed for you to reduce the risk of blood clots forming after orthopedic surgery.    What do You need to know about Eliquis? Take your Eliquis TWICE DAILY - one tablet in the morning and one tablet in the evening with or without food.  It would be best to take the dose about the same time each day.  If you have difficulty swallowing the tablet whole please discuss with your pharmacist how to take the medication safely.  Take Eliquis exactly as prescribed by your doctor and DO NOT stop taking Eliquis without talking to the doctor who prescribed the medication.  Stopping without other  medication to take the  place of Eliquis may increase your risk of developing a clot.  After discharge, you should have regular check-up appointments with your healthcare provider that is prescribing your Eliquis.  What do you do if you miss a dose? If a dose of ELIQUIS is not taken at the scheduled time, take it as soon as possible on the same day and twice-daily administration should be resumed.  The dose should not be doubled to make up for a missed dose.  Do not take more than one tablet of ELIQUIS at the same time.  Important Safety Information A possible side effect of Eliquis is bleeding. You should call your healthcare provider right away if you experience any of the following: ? Bleeding from an injury or your nose that does not stop. ? Unusual colored urine (red or dark brown) or unusual colored stools (red or black). ? Unusual bruising for unknown reasons. ? A serious fall or if you hit your head (even if there is no bleeding).  Some medicines may interact with Eliquis and might increase your risk of bleeding or clotting while on Eliquis. To help avoid this, consult your healthcare provider or pharmacist prior to using any new prescription or non-prescription medications, including herbals, vitamins, non-steroidal anti-inflammatory drugs (NSAIDs) and supplements.  This website has more information on Eliquis (apixaban): http://www.eliquis.com/eliquis/home

## 2014-12-31 NOTE — Progress Notes (Signed)
Please review pain meds with pt. I gave her everything ordered and she was whimpering, crying, and a 10/10 every time she needed them. But slept between doses. Pt made comment that she hoped she did not get addicted to the Oxy IR b/c everyone on her street is addicted to that. I advised her that if she was taking it for what it was meant for, the is a very low chance she will become addicted.  I will discuss with patient. Thank you. -Tawanna Cooler, PA-C

## 2014-12-31 NOTE — Progress Notes (Signed)
Subjective: 1 Day Post-Op Procedure(s) (LRB): TOTAL KNEE ARTHROPLASTY (Right) Patient reports pain as moderate.  Mild nausea yesterday.  None today.  No vomiting.  Occasional lightheadedness with oxycodone.  Negative flatus/bm.  Good appetite.  Patient states she got up to the floor around 2pm yesterday.  Unsure why PT did not work with her at all.  Objective: Vital signs in last 24 hours: Temp:  [97.5 F (36.4 C)-98.6 F (37 C)] 98.2 F (36.8 C) (04/21 0519) Pulse Rate:  [72-100] 99 (04/21 0519) Resp:  [12-21] 18 (04/21 0519) BP: (88-172)/(53-117) 158/93 mmHg (04/21 0519) SpO2:  [90 %-100 %] 97 % (04/21 0519) Weight:  [85.73 kg (189 lb)] 85.73 kg (189 lb) (04/20 0830)  Intake/Output from previous day: 04/20 0701 - 04/21 0700 In: 2000 [I.V.:2000] Out: 645 [Urine:300; Drains:320; Blood:25] Intake/Output this shift: Total I/O In: -  Out: 120 [Drains:120]  No results for input(s): HGB in the last 72 hours. No results for input(s): WBC, RBC, HCT, PLT in the last 72 hours. No results for input(s): NA, K, CL, CO2, BUN, CREATININE, GLUCOSE, CALCIUM in the last 72 hours. No results for input(s): LABPT, INR in the last 72 hours.  Neurologically intact Neurovascular intact Sensation intact distally Intact pulses distally Dorsiflexion/Plantar flexion intact Compartment soft  hemovac drain pulled by me today Negative homans bilaterally  Assessment/Plan: 1 Day Post-Op Procedure(s) (LRB): TOTAL KNEE ARTHROPLASTY (Right) Advance diet Up with therapy D/C IV fluids Discharge home with home health if pain is under control and she works well with PT WBAT RLE Dry dressing change prn Added celebrex this am   Fannie Knee 12/31/2014, 6:57 AM

## 2014-12-31 NOTE — Progress Notes (Addendum)
At 2119 I was getting report on pt in room 7 from PACU nurse. Husband of pt 13 walked down the hall and asked if pt could get iv dilaudid. I told him yes, after I finished getting report from nurse on post op pt. Husband seemed fine and went back to pt's room. While I was in post op's room making sure pt was stable, received call that pt in 13's iv had come out and was bleeding. I went in and NT was cleaning up blood and iv was hanging out. Pt stated that she was having pain in back and knee. I told her i could not give her dilaudid until I placed a new IV in her. Husband went off and started screaming at me. Told me I should have come and given the dilaudid instead of just sitting down the hall 'talking' with that other nurse. He just kept going off on me and I finally told him I had had enough of his verbal abuse and to please let me do my job and he could tell Dr. Percell Miller. I told him I was fine having a conversation with the dr. He left the room while I inserted iv and medicated pt. Before he left he came in and woke up his wife and aplogized. He said he just didn't like seeing his wife in pain. At 2000 pt had 500 of robaxin and 10 mg of oxy ir.  Pt eventually pulled out iv i put in and genevieve our float nurse put another one in right arm around midnight.

## 2014-12-31 NOTE — Progress Notes (Signed)
Physical Therapy Treatment Patient Details Name: Barbara Dyer MRN: [id number] DOB: 01/02/1966 Today's Date: 12/31/2014    History of Present Illness Patient is a 49 y/o female s/p right TKA.    PT Comments    Progressing with ambulation distance, weight tolerance right LE and with AAROM this session.  Will practice stairs in am prior to d/c.  Follow Up Recommendations  Home health PT;Supervision/Assistance - 24 hour     Equipment Recommendations  None recommended by PT    Recommendations for Other Services       Precautions / Restrictions Precautions Precautions: Fall;Knee Required Braces or Orthoses: Knee Immobilizer - Right Knee Immobilizer - Right: On when out of bed or walking Restrictions LLE Weight Bearing: Weight bearing as tolerated    Mobility  Bed Mobility Overal bed mobility: Needs Assistance Bed Mobility: Sit to Supine       Sit to supine: Min guard   General bed mobility comments: for right LE  Transfers Overall transfer level: Needs assistance Equipment used: Rolling walker (2 wheeled) Transfers: Sit to/from Stand Sit to Stand: Min guard Stand pivot transfers: Supervision       General transfer comment: good carry over of hand placement from previous session  Ambulation/Gait Ambulation/Gait assistance: Min guard;Supervision Ambulation Distance (Feet): 160 Feet Assistive device: Rolling walker (2 wheeled) Gait Pattern/deviations: Shuffle;Step-to pattern;Antalgic     General Gait Details: improved weight tolerance on right, but still shuffling left foot with decreased weight bearing on right.   Stairs            Wheelchair Mobility    Modified Rankin (Stroke Patients Only)       Balance Overall balance assessment: Needs assistance           Standing balance-Leahy Scale: Fair                      Cognition Arousal/Alertness: Awake/alert Behavior During Therapy: WFL for tasks assessed/performed Overall  Cognitive Status: Within Functional Limits for tasks assessed                      Exercises Total Joint Exercises Ankle Circles/Pumps: AROM;Both;10 reps;Seated Quad Sets: AROM;Both;Seated;Other reps (comment) (8) Heel Slides: AAROM;Right;Seated;Other reps (comment) (8) Straight Leg Raises: Right;AAROM;Other reps (comment);Seated (8)    General Comments        Pertinent Vitals/Pain Pain Assessment: 0-10 Pain Score: 7  Pain Location: r knee with movement Pain Descriptors / Indicators: Aching Pain Intervention(s): Monitored during session;Repositioned    Home Living Family/patient expects to be discharged to:: Private residence Living Arrangements: Spouse/significant other Available Help at Discharge: Family;Available 24 hours/day Type of Home: Mobile home Home Access: Stairs to enter Entrance Stairs-Rails: Right;Left Home Layout: One level Home Equipment: Environmental consultant - 2 wheels;Bedside commode      Prior Function Level of Independence: Independent          PT Goals (current goals can now be found in the care plan section) Acute Rehab PT Goals Patient Stated Goal: to go home Progress towards PT goals: Progressing toward goals    Frequency  7X/week    PT Plan Current plan remains appropriate    Co-evaluation             End of Session Equipment Utilized During Treatment: Gait belt;Right knee immobilizer Activity Tolerance: Patient tolerated treatment well Patient left: in bed;with call bell/phone within reach;in CPM     Time: 9562-1308 PT Time Calculation (min) (ACUTE ONLY): 35 min  Charges:  $Gait Training: 8-22 mins $Therapeutic Exercise: 8-22 mins                    G Codes:      Linnette Panella,CYNDI 01-06-2015, 2:37 PM  Magda Kiel, [place] 01/06/2015

## 2014-12-31 NOTE — Progress Notes (Signed)
Occupational Therapy Evaluation Patient Details Name: Barbara Dyer MRN: [id number] DOB: [birth year] Today's Date: 12/31/2014    History of Present Illness Patient is a 49 y/o female s/p right TKA.   Clinical Impression   Pt making excellent progress. Completed education regarding safety, mobility  and compensatory techniques for ADL. Pt will have 24/7 S after D/C. Pt ready to D/C from OT standpoint when able to navigate stairs. OT signing off.      Follow Up Recommendations  No OT follow up;Supervision - Intermittent    Equipment Recommendations  None recommended by OT    Recommendations for Other Services       Precautions / Restrictions Precautions Precautions: Fall;Knee Required Braces or Orthoses: Knee Immobilizer - Right Restrictions LLE Weight Bearing: Weight bearing as tolerated      Mobility Bed Mobility               General bed mobility comments: pt up in chair  Transfers Overall transfer level: Needs assistance Equipment used: Rolling walker (2 wheeled) Transfers: Sit to/from [employer] Transfers Sit to Stand: Supervision Stand pivot transfers: Supervision       General transfer comment: good carry over of hand placement from previous session    Balance Overall balance assessment: Needs assistance         Standing balance support: During functional activity;No upper extremity supported Standing balance-Leahy Scale: Fair Standing balance comment: completed hygiene after toileting @ mod i level                            ADL Overall ADL's : Needs assistance/impaired                                     Functional mobility during ADLs: Supervision/safety General ADL Comments: Educated pt/family on use of 3 in 1 for toileting and in tub. bathroom not RW accessible. Recommended for pt to review tub transfers wtih [facility] therapist after D/C and once pt able to get into bathroom. Educated on compensatory techniques  for ADL and discussed home saety to reduce risk of falls. Pt verbalized understanding.   Family can assist as needed.                     Pertinent Vitals/Pain Pain Assessment: 0-10 Pain Score: 7  Pain Location: R knee Pain Descriptors / Indicators: Aching;Sore     Hand Dominance     Extremity/Trunk Assessment Upper Extremity Assessment Upper Extremity Assessment: Overall WFL for tasks assessed       Cervical / Trunk Assessment Cervical / Trunk Assessment: Normal   Communication Communication Communication: No difficulties   Cognition Arousal/Alertness: Awake/alert Behavior During Therapy: WFL for tasks assessed/performed Overall Cognitive Status: Within Functional Limits for tasks assessed                                        Home Living Family/patient expects to be discharged to:: Private residence Living Arrangements: Spouse/significant other Available Help at Discharge: Family;Available 24 hours/day Type of Home: Mobile home Home Access: Stairs to enter Entrance Stairs-Number of Steps: 4 Entrance Stairs-Rails: Right;Left Home Layout: One level     Bathroom Shower/Tub: Tub/shower unit Shower/tub characteristics: Curtain   Bathroom Accessibility: No   Home Equipment: Environmental consultant - 2 wheels;Bedside  commode          Prior Functioning/Environment Level of Independence: Independent             OT Diagnosis: Generalized weakness;Acute pain   OT Problem List: Decreased strength;Decreased range of motion;Decreased activity tolerance;Decreased knowledge of use of DME or AE;Obesity;Pain   OT Treatment/Interventions:      OT Goals(Current goals can be found in the care plan section) Acute Rehab OT Goals Patient Stated Goal: to go home OT Goal Formulation: All assessment and education complete, DC therapy  OT Frequency:     Barriers to D/C:            Co-evaluation              End of Session Equipment Utilized During  Treatment: Gait belt;Rolling walker;Right knee immobilizer CPM Left Knee CPM Left Knee: Off CPM Right Knee CPM Right Knee: Off Nurse Communication: Mobility status  Activity Tolerance: Patient tolerated treatment well Patient left: in chair;with call bell/phone within reach;with family/visitor present   Time: 1310-1336 OT Time Calculation (min): 26 min Charges:  OT General Charges $OT Visit: 1 Procedure OT Evaluation $Initial OT Evaluation Tier I: 1 Procedure OT Treatments $Self Care/Home Management : 8-22 mins G-Codes:    [place] Stanislaw,[place] 07-Jan-2015, 2:16 PM   [facility], OTR/L  [phone] 01/07/2015

## 2015-01-01 LAB — BASIC METABOLIC PANEL
Anion gap: 11 (ref 5–15)
BUN: <5 mg/dL — ABNORMAL LOW (ref 6–23)
CHLORIDE: 96 mmol/L (ref 96–112)
CO2: 26 mmol/L (ref 19–32)
Calcium: 8.7 mg/dL (ref 8.4–10.5)
Creatinine, Ser: 0.59 mg/dL (ref 0.50–1.10)
Glucose, Bld: 180 mg/dL — ABNORMAL HIGH (ref 70–99)
POTASSIUM: 3.6 mmol/L (ref 3.5–5.1)
SODIUM: 133 mmol/L — AB (ref 135–145)

## 2015-01-01 LAB — CBC
HCT: 37.9 % (ref 36.0–46.0)
Hemoglobin: 13.5 g/dL (ref 12.0–15.0)
MCH: 33 pg (ref 26.0–34.0)
MCHC: 35.6 g/dL (ref 30.0–36.0)
MCV: 92.7 fL (ref 78.0–100.0)
PLATELETS: 207 10*3/uL (ref 150–400)
RBC: 4.09 MIL/uL (ref 3.87–5.11)
RDW: 12.3 % (ref 11.5–15.5)
WBC: 10.9 10*3/uL — AB (ref 4.0–10.5)

## 2015-01-01 LAB — GLUCOSE, CAPILLARY: Glucose-Capillary: 190 mg/dL — ABNORMAL HIGH (ref 70–99)

## 2015-01-01 NOTE — Care Management Note (Signed)
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 Patient underwent a left total knee arthroplasty.     Action/Plan:   Case manager spoke with patient concerning [place] and DME needs. Patient preoperatively setup with [facility], no changes. Patient has support at discharge,   Anticipated DC Date:  01/01/2015   Anticipated DC Plan:  [place]  CM consult      [facility]   Choice offered to / List presented to:  C-1 Patient   DME arranged  [facility]  3-N-1  [facility]      DME agency  [employer]     [place] arranged  HH-2 PT      [facility]   Status of service:  Completed, signed off Medicare Important Message given?   (If response is "NO", the following Medicare IM given date fields will be blank) Date Medicare IM given:   Medicare IM given by:   Date Additional Medicare IM given:   Additional Medicare IM given by:    Discharge Disposition:  [place]  Per UR Regulation:  Reviewed for med. necessity/level of care/duration of stay  If discussed at [place] of Stay Meetings, dates discussed:

## 2015-01-01 NOTE — Progress Notes (Signed)
Discharge instruction gave to pt and all questions answered. Pt is ready to discharge.

## 2015-01-01 NOTE — Progress Notes (Signed)
Subjective: 2 Days Post-Op Procedure(s) (LRB): TOTAL KNEE ARTHROPLASTY (Right) Patient reports pain as moderate.  No nausea/vomiting, lightheadedness/dizziness, chest pain/sob.  Patient does report dry cough for the past few days.  Positive flatus and bm.  Tolerating diet although not great appetite.  Objective: Vital signs in last 24 hours: Temp:  [98.5 F (36.9 C)-99.8 F (37.7 C)] 98.5 F (36.9 C) (04/22 0607) Pulse Rate:  [85-112] 112 (04/22 0607) Resp:  [16-19] 18 (04/22 0607) BP: (108-156)/(64-90) 142/87 mmHg (04/22 0607) SpO2:  [92 %-96 %] 96 % (04/22 0607)  Intake/Output from previous day: 04/21 0701 - 04/22 0700 In: 1320 [P.O.:1320] Out: -  Intake/Output this shift:     Recent Labs  12/31/14 0745  HGB 13.1    Recent Labs  12/31/14 0745  WBC 9.9  RBC 4.07  HCT 37.7  PLT 213    Recent Labs  12/31/14 0745  NA 136  K 3.7  CL 99  CO2 27  BUN <5*  CREATININE 0.48*  GLUCOSE 170*  CALCIUM 8.8   No results for input(s): LABPT, INR in the last 72 hours.  Neurologically intact Neurovascular intact Sensation intact distally Intact pulses distally Dorsiflexion/Plantar flexion intact Incision: scant drainage No cellulitis present Compartment soft  Dressing changed by me today Negative homans bilaterally  Assessment/Plan: 2 Days Post-Op Procedure(s) (LRB): TOTAL KNEE ARTHROPLASTY (Right) Advance diet Up with therapy Discharge home with home health most likely after AM PT session WBAT RLE Encouraged incentive spirometer Dry dressing change prn  Fannie Knee 01/01/2015, 7:18 AM

## 2015-01-01 NOTE — Progress Notes (Signed)
Physical Therapy Treatment Patient Details Name: Barbara Dyer MRN: [id number] DOB: [birth year] Today's Date: 01-26-2015    History of Present Illness Patient is a 49 y/o female s/p right TKA.    PT Comments    Tolerating gait much better and able to negotiate stairs.  Feel patient safe for d/c home with family assist and HHPT follow up.  Follow Up Recommendations  Home health PT;Supervision/Assistance - 24 hour     Equipment Recommendations  None recommended by PT    Recommendations for Other Services       Precautions / Restrictions Precautions Precautions: Fall;Knee Required Braces or Orthoses: Knee Immobilizer - Right Knee Immobilizer - Right: On when out of bed or walking Restrictions RLE Weight Bearing: Weight bearing as tolerated    Mobility  Bed Mobility           Sit to supine: Modified independent (Device/Increase time)      Transfers   Equipment used: Rolling walker (2 wheeled) Transfers: Sit to/from Stand Sit to Stand: Supervision         General transfer comment: for safety, keeping walker close  Ambulation/Gait Ambulation/Gait assistance: Supervision Ambulation Distance (Feet): 150 Feet Assistive device: Rolling walker (2 wheeled) Gait Pattern/deviations: Step-through pattern;Antalgic     General Gait Details: better tolerance to weight on right LE   Stairs Stairs: Yes Stairs assistance: Min guard Stair Management: Step to pattern;Forwards;Two rails Number of Stairs: 4 General stair comments: cues for sequence and demo  Wheelchair Mobility    Modified Rankin (Stroke Patients Only)       Balance             Standing balance-Leahy Scale: Fair                      Cognition Arousal/Alertness: Awake/alert Behavior During Therapy: WFL for tasks assessed/performed Overall Cognitive Status: Within Functional Limits for tasks assessed                      Exercises Total Joint Exercises Ankle  Circles/Pumps: AROM;Both;10 reps;Supine Quad Sets: AROM;Both;Other reps (comment);Supine Short Arc Quad: AAROM;Right;AROM;10 reps;Supine Heel Slides: AAROM;Right;Supine Hip ABduction/ADduction: AROM;Right;10 reps;Supine Straight Leg Raises: Right;Seated;AROM;AAROM;Supine;10 reps    General Comments General comments (skin integrity, edema, etc.): educated in procedure for car transfers to SUV with running board      Pertinent Vitals/Pain Pain Score: 5  Pain Location: right knee Pain Intervention(s): Monitored during session    Home Living                      Prior Function            PT Goals (current goals can now be found in the care plan section) Progress towards PT goals: Progressing toward goals    Frequency  7X/week    PT Plan Current plan remains appropriate    Co-evaluation             End of Session Equipment Utilized During Treatment: Gait belt;Right knee immobilizer Activity Tolerance: Patient tolerated treatment well Patient left: in bed;with call bell/phone within reach     Time: 0935-1005 PT Time Calculation (min) (ACUTE ONLY): 30 min  Charges:  $Gait Training: 8-22 mins $Therapeutic Exercise: 8-22 mins                    G Codes:      WYNN,CYNDI 01/26/2015, 4:21 PM Magda Kiel, [place] 2015/01/26

## 2015-01-04 ENCOUNTER — Encounter (HOSPITAL_COMMUNITY): Payer: Self-pay | Admitting: Orthopedic Surgery

## 2015-01-05 NOTE — Anesthesia Postprocedure Evaluation (Signed)
  Anesthesia Post-op Note  Patient: Barbara Dyer  Procedure(s) Performed: Procedure(s): TOTAL KNEE ARTHROPLASTY (Right)  Patient Location: PACU  Anesthesia Type:General  Level of Consciousness: awake, alert  and oriented  Airway and Oxygen Therapy: Patient Spontanous Breathing and Patient connected to nasal cannula oxygen  Post-op Pain: none  Post-op Assessment: Post-op Vital signs reviewed, Patient's Cardiovascular Status Stable, Respiratory Function Stable, Patent Airway and Pain level controlled  Post-op Vital Signs: stable  Last Vitals:  Filed Vitals:   01/01/15 0607  BP: 142/87  Pulse: 112  Temp: 36.9 C  Resp: 18    Complications: No apparent anesthesia complications

## 2015-01-12 ENCOUNTER — Other Ambulatory Visit: Payer: Self-pay | Admitting: Orthopedic Surgery

## 2015-01-12 ENCOUNTER — Ambulatory Visit (HOSPITAL_COMMUNITY): Payer: BLUE CROSS/BLUE SHIELD | Attending: Internal Medicine

## 2015-01-12 DIAGNOSIS — Z720 Tobacco use: Secondary | ICD-10-CM | POA: Insufficient documentation

## 2015-01-12 DIAGNOSIS — E785 Hyperlipidemia, unspecified: Secondary | ICD-10-CM | POA: Diagnosis not present

## 2015-01-12 DIAGNOSIS — E119 Type 2 diabetes mellitus without complications: Secondary | ICD-10-CM | POA: Diagnosis not present

## 2015-01-12 DIAGNOSIS — M7989 Other specified soft tissue disorders: Secondary | ICD-10-CM | POA: Diagnosis not present

## 2015-01-12 DIAGNOSIS — M79604 Pain in right leg: Secondary | ICD-10-CM

## 2015-01-12 DIAGNOSIS — M79605 Pain in left leg: Secondary | ICD-10-CM

## 2015-06-25 ENCOUNTER — Emergency Department (HOSPITAL_COMMUNITY)
Admission: EM | Admit: 2015-06-25 | Discharge: 2015-06-25 | Disposition: A | Discharge status: Home / Self Care | Payer: BLUE CROSS/BLUE SHIELD | Attending: Emergency Medicine | Admitting: Emergency Medicine

## 2015-06-25 ENCOUNTER — Encounter (HOSPITAL_COMMUNITY): Payer: Self-pay

## 2015-06-25 DIAGNOSIS — E119 Type 2 diabetes mellitus without complications: Secondary | ICD-10-CM | POA: Insufficient documentation

## 2015-06-25 DIAGNOSIS — Z720 Tobacco use: Secondary | ICD-10-CM | POA: Insufficient documentation

## 2015-06-25 DIAGNOSIS — Z79899 Other long term (current) drug therapy: Secondary | ICD-10-CM | POA: Diagnosis not present

## 2015-06-25 DIAGNOSIS — R000 Tachycardia, unspecified: Secondary | ICD-10-CM | POA: Insufficient documentation

## 2015-06-25 DIAGNOSIS — K625 Hemorrhage of anus and rectum: Secondary | ICD-10-CM | POA: Insufficient documentation

## 2015-06-25 DIAGNOSIS — M1990 Unspecified osteoarthritis, unspecified site: Secondary | ICD-10-CM | POA: Insufficient documentation

## 2015-06-25 DIAGNOSIS — Z7982 Long term (current) use of aspirin: Secondary | ICD-10-CM | POA: Diagnosis not present

## 2015-06-25 LAB — URINALYSIS, ROUTINE W REFLEX MICROSCOPIC
Bilirubin Urine: NEGATIVE
Glucose, UA: NEGATIVE mg/dL
Ketones, ur: NEGATIVE mg/dL
LEUKOCYTES UA: NEGATIVE
NITRITE: NEGATIVE
PROTEIN: NEGATIVE mg/dL
SPECIFIC GRAVITY, URINE: 1.012 (ref 1.005–1.030)
Urobilinogen, UA: 0.2 mg/dL (ref 0.0–1.0)
pH: 5 (ref 5.0–8.0)

## 2015-06-25 LAB — URINE MICROSCOPIC-ADD ON

## 2015-06-25 LAB — COMPREHENSIVE METABOLIC PANEL
ALK PHOS: 73 U/L (ref 38–126)
ALT: 25 U/L (ref 14–54)
AST: 31 U/L (ref 15–41)
Albumin: 3.5 g/dL (ref 3.5–5.0)
Anion gap: 7 (ref 5–15)
BUN: 10 mg/dL (ref 6–20)
CALCIUM: 7.7 mg/dL — AB (ref 8.9–10.3)
CHLORIDE: 112 mmol/L — AB (ref 101–111)
CO2: 20 mmol/L — AB (ref 22–32)
CREATININE: 0.53 mg/dL (ref 0.44–1.00)
GFR calc Af Amer: >60 mL/min (ref >60)
GFR calc non Af Amer: >60 mL/min (ref >60)
Glucose, Bld: 149 mg/dL — ABNORMAL HIGH (ref 65–99)
Potassium: 3.5 mmol/L (ref 3.5–5.1)
SODIUM: 139 mmol/L (ref 135–145)
Total Bilirubin: 0.4 mg/dL (ref 0.3–1.2)
Total Protein: 5.5 g/dL — ABNORMAL LOW (ref 6.5–8.1)

## 2015-06-25 LAB — CBC
HCT: 36.5 % (ref 36.0–46.0)
Hemoglobin: 12.8 g/dL (ref 12.0–15.0)
MCH: 32.9 pg (ref 26.0–34.0)
MCHC: 35.1 g/dL (ref 30.0–36.0)
MCV: 93.8 fL (ref 78.0–100.0)
Platelets: 201 10*3/uL (ref 150–400)
RBC: 3.89 MIL/uL (ref 3.87–5.11)
RDW: 12.5 % (ref 11.5–15.5)
WBC: 8.1 10*3/uL (ref 4.0–10.5)

## 2015-06-25 LAB — TYPE AND SCREEN
ABO/RH(D): O POS
Antibody Screen: NEGATIVE

## 2015-06-25 LAB — PROTIME-INR
INR: 0.99 (ref 0.00–1.49)
Prothrombin Time: 13.3 seconds (ref 11.6–15.2)

## 2015-06-25 LAB — POC OCCULT BLOOD, ED: FECAL OCCULT BLD: POSITIVE — AB

## 2015-06-25 MED ORDER — PANTOPRAZOLE SODIUM 40 MG IV SOLR
40.0000 mg | Freq: Once | INTRAVENOUS | Status: AC
  Administered 2015-06-25: 40 mg via INTRAVENOUS
  Filled 2015-06-25: qty 40

## 2015-06-25 MED ORDER — SODIUM CHLORIDE 0.9 % IV BOLUS (SEPSIS)
1000.0000 mL | Freq: Once | INTRAVENOUS | Status: AC
  Administered 2015-06-25: 1000 mL via INTRAVENOUS

## 2015-06-25 NOTE — ED Notes (Signed)
Pt reports rectal bleeding that is a chronic issue for her but has worsened over the past week.  Pt reports she was diagnosed with chron's and bleeding ulcers "several years ago."  Pt reports the bleeding increases after having a bowel movement.  Pt reports the bleeding is not an issue any other time besides after BM.

## 2015-06-25 NOTE — ED Provider Notes (Signed)
CSN: [id number]     Arrival date & time 06/25/15  0805 History   First MD Initiated Contact with Patient 06/25/15 [phone]     Chief Complaint  Patient presents with  . Rectal Bleeding     (Consider location/radiation/quality/duration/timing/severity/associated sxs/prior Treatment) Patient is a 49 y.o. female presenting with hematochezia. The history is provided by the patient.  Rectal Bleeding Quality:  Black and tarry and bright red Timing:  Constant Progression:  Worsening Chronicity:  Recurrent Context: diarrhea and hemorrhoids   Similar prior episodes: yes   Relieved by:  Nothing Worsened by:  Nothing tried Ineffective treatments:  None tried Associated symptoms: abdominal pain   Associated symptoms: no fever and no vomiting    Ms. Livingstone is a 49 yo F PMH DM2 that is presenting today with blood per rectum. Has been worsening over the past 3 days. She has had some melena and hematochezia. She has had blood in her stool for the past month. She has had an evaluation by gastroenterologist about 10 years ago by colonoscopy for bleeding per rectum and had a suggestion of Crohn's disease. She was seen by specialists at [facility] and he didn't give her the diagnosis of Crohn's disease. She is on no medications for this. She reports also having a history of diverticulosis and a family history of diverticulosis. She has watery diarrhea at baseline. She has had 10-15 movements per day over the past month. She denies any ulcers in her mouth or rectum. She also reports a 30 pound unintentional weight loss over the past month. She denies any chest pain, shortness of breath, fever, chills, vomiting or any rashes. She does endorse abdominal pain, joint pain, nausea, and dysuria. Has a history of hysterectomy, appendectomy, and cholecystectomy. Denies any alcohol or illicit drug use but is a current smoker. Denies any travel or eating any foreign food to her.  Past Medical History  Diagnosis Date  .  Diabetes mellitus without complication ([place])   . Arthritis    Past Surgical History  Procedure Laterality Date  . Carpal tunnel release Bilateral   . Back surgery    . Knee arthroscopy Bilateral   . Appendectomy    . Abdominal hysterectomy      plus tubes and ovaries 2nd surgery  . Breast surgery Left     bx  . Diagnostic laparoscopy      x3 for endometriosis  . Total knee arthroplasty Right 12/30/2014  . Total knee arthroplasty Right 12/30/2014    Procedure: TOTAL KNEE ARTHROPLASTY;  Surgeon: Kathryne Hitch, MD;  Location: [facility];  Service: Orthopedics;  Laterality: Right;   History reviewed. No pertinent family history. Social History  Substance Use Topics  . Smoking status: Current Some Day Smoker -- 1.00 packs/day for 25 years    Types: Cigarettes  . Smokeless tobacco: Never Used  . Alcohol Use: No   OB History    No data available     Review of Systems  Constitutional: Negative for fever and chills.  Respiratory: Negative for shortness of breath.   Cardiovascular: Negative for chest pain.  Gastrointestinal: Positive for nausea, abdominal pain, diarrhea, blood in stool, hematochezia and anal bleeding. Negative for vomiting and constipation.  Genitourinary: Positive for dysuria.      Allergies  Morphine and related; Prednisone; Lactose intolerance (gi); Humulin; and Tape  Home Medications   Prior to Admission medications   Medication Sig Start Date End Date Taking? Authorizing Provider  acetaminophen (TYLENOL) 325 MG tablet Take 650  mg by mouth every 6 (six) hours as needed (pain).   Yes Historical Provider, MD  aspirin 81 MG tablet Take 81 mg by mouth daily.   Yes Historical Provider, MD  atorvastatin (LIPITOR) 20 MG tablet Take 20 mg by mouth daily.   Yes Historical Provider, MD  BEE POLLEN PO Take 1 tablet by mouth 4 (four) times daily as needed (allergies).   Yes Historical Provider, MD  BLACK COHOSH PO Take 1 capsule by mouth 2 (two) times daily.   Yes  Historical Provider, MD  Flaxseed, Linseed, (FLAX SEED OIL PO) Take 1 capsule by mouth daily.   Yes Historical Provider, MD  Ginkgo Biloba (GNP GINGKO BILOBA EXTRACT PO) Take 1 capsule by mouth 2 (two) times daily.    Yes Historical Provider, MD  lisinopril (PRINIVIL,ZESTRIL) 10 MG tablet Take 10 mg by mouth daily. 06/23/15  Yes Historical Provider, MD  metFORMIN (GLUCOPHAGE-XR) 500 MG 24 hr tablet Take 1,000 mg by mouth every evening.  12/04/14  Yes Historical Provider, MD  Multiple Vitamin (MULTIVITAMIN) tablet Take 2 tablets by mouth daily.    Yes Historical Provider, MD  Pseudoephedrine HCl (SUDAFED 24 HOUR PO) Take 1 tablet by mouth 4 (four) times daily as needed (for allergies).   Yes Historical Provider, MD  sodium chloride (OCEAN) 0.65 % SOLN nasal spray Place 1 spray into both nostrils as needed for congestion.   Yes Historical Provider, MD  Wheat Dextrin (BENEFIBER DRINK MIX PO) Take 2 each by mouth 2 (two) times daily.   Yes Historical Provider, MD   BP 94/52 mmHg  Pulse 87  Temp(Src) 98.1 F (36.7 C) (Oral)  Resp 18  Ht 5' (1.524 m)  Wt 190 lb (86.183 kg)  BMI 37.11 kg/m2  SpO2 97% Physical Exam  Constitutional: She is oriented to person, place, and time. She appears well-developed and well-nourished.  HENT:  Head: Normocephalic and atraumatic.  Eyes: Conjunctivae and EOM are normal.  Neck: Normal range of motion. Neck supple.  Cardiovascular: Regular rhythm, normal heart sounds and intact distal pulses.  Tachycardia present.   No murmur heard. Pulmonary/Chest: Effort normal and breath sounds normal. No respiratory distress. She has no wheezes.  Abdominal: Soft. Bowel sounds are normal. She exhibits no distension. There is tenderness. There is no rebound and no guarding.    Genitourinary:  External hemorrhoids visualized  No anal fissures  Normal sphincter tone.   Musculoskeletal: Normal range of motion. She exhibits no edema.  Neurological: She is alert and oriented to  person, place, and time.  Skin: Skin is warm. No rash noted.  Psychiatric: She has a normal mood and affect.    ED Course  Procedures (including critical care time) Labs Review Labs Reviewed  COMPREHENSIVE METABOLIC PANEL - Abnormal; Notable for the following:    Chloride 112 (*)    CO2 20 (*)    Glucose, Bld 149 (*)    Calcium 7.7 (*)    Total Protein 5.5 (*)    All other components within normal limits  URINALYSIS, ROUTINE W REFLEX MICROSCOPIC (NOT AT [facility]) - Abnormal; Notable for the following:    APPearance HAZY (*)    Hgb urine dipstick SMALL (*)    All other components within normal limits  URINE MICROSCOPIC-ADD ON - Abnormal; Notable for the following:    Squamous Epithelial / LPF FEW (*)    All other components within normal limits  POC OCCULT BLOOD, ED - Abnormal; Notable for the following:    Fecal  Occult Bld POSITIVE (*)    All other components within normal limits  CBC  PROTIME-INR  TYPE AND SCREEN    Imaging Review No results found. I have personally reviewed and evaluated these images and lab results as part of my medical decision-making.   EKG Interpretation None      Medications  sodium chloride 0.9 % bolus 1,000 mL (0 mLs Intravenous Stopped 06/25/15 1041)  pantoprazole (PROTONIX) injection 40 mg (40 mg Intravenous Given 06/25/15 0902)    MDM   Final diagnoses:  Bright red blood per rectum    Ms. Gardenhire is presenting with blood per rectum. External hemorrhoids were observed and FOBT positive. BUN no suggestive an UGI bleed, Hgb in normal range and tachycardia improved with a bolus of fluids. Reports hx of diverticulosis and possible hx of Crohn's. Advised that she follow up with her PCP. She can follow up with GI as outpatient. Patient stable for discharge.   Rosemarie Ax, MD PGY-3, [facility] 06/25/2015, 11:51 AM      Rosemarie Ax, MD 06/25/15 [place], MD 06/25/15 2127

## 2015-06-25 NOTE — ED Notes (Signed)
Patient undressed, in gown, on continuous pulse oximetry and blood pressure cuff; vitals performed by me; visitor at bedside; warm blanket given

## 2015-06-25 NOTE — ED Provider Notes (Signed)
I saw and evaluated the patient, reviewed the resident's note and I agree with the findings and plan.   EKG Interpretation None      49 yo female presenting with chief complaint of bloody stools. She has a long history of this, but is worse over the past 3 days. She describes the blood initially is bright red, then darkening somewhat today. She has hemorrhoids and thinks the bleeding secondary to this. She endorses both diarrhea and constipation. Blood is described as streaking on stool not mixed in with stool.  On exam, well appearing, nontoxic, not distressed, normal respiratory effort, normal perfusion, abdomen soft, minimally tender in right lower quadrant and suprapubic region, no rigidity, rebound, or guarding. Per resident physician, no melena or gross blood on DRE.  I suspect her GI bleeding is secondary to her hemorrhoids.  Will fu with PCP and GI.    Clinical Impression: 1. Bright red blood per rectum       Serita Grit, MD 06/25/15 2127

## 2015-06-25 NOTE — Discharge Instructions (Signed)
Gastrointestinal Bleeding °Gastrointestinal bleeding is bleeding somewhere along the path that food travels through the body (digestive tract). This path is anywhere between the mouth and the opening of the butt (anus). You may have blood in your throw up (vomit) or in your poop (stools). If there is a lot of bleeding, you may need to stay in the hospital. °HOME CARE °· Only take medicine as told by your doctor. °· Eat foods with fiber such as whole grains, fruits, and vegetables. You can also try eating 1 to 3 prunes a day. °· Drink enough fluids to keep your pee (urine) clear or pale yellow. °GET HELP RIGHT AWAY IF:  °· Your bleeding gets worse. °· You feel dizzy, weak, or you pass out (faint). °· You have bad cramps in your back or belly (abdomen). °· You have large blood clumps (clots) in your poop. °· Your problems are getting worse. °MAKE SURE YOU:  °· Understand these instructions. °· Will watch your condition. °· Will get help right away if you are not doing well or get worse. °  °This information is not intended to replace advice given to you by your health care provider. Make sure you discuss any questions you have with your health care provider. °  °Document Released: 06/06/2008 Document Revised: 08/14/2012 Document Reviewed: 02/15/2015 °Elsevier Interactive Patient Education ©2016 Elsevier Inc. ° °

## 2015-10-05 ENCOUNTER — Other Ambulatory Visit (INDEPENDENT_AMBULATORY_CARE_PROVIDER_SITE_OTHER): Payer: BLUE CROSS/BLUE SHIELD

## 2015-10-05 ENCOUNTER — Ambulatory Visit (INDEPENDENT_AMBULATORY_CARE_PROVIDER_SITE_OTHER): Payer: BLUE CROSS/BLUE SHIELD | Admitting: Gastroenterology

## 2015-10-05 ENCOUNTER — Encounter: Payer: Self-pay | Admitting: Gastroenterology

## 2015-10-05 ENCOUNTER — Other Ambulatory Visit: Payer: Self-pay | Admitting: Gastroenterology

## 2015-10-05 ENCOUNTER — Other Ambulatory Visit: Payer: BLUE CROSS/BLUE SHIELD

## 2015-10-05 VITALS — BP 102/68 | HR 80 | Ht 60.0 in | Wt 200.5 lb

## 2015-10-05 DIAGNOSIS — K5010 Crohn's disease of large intestine without complications: Secondary | ICD-10-CM | POA: Diagnosis not present

## 2015-10-05 DIAGNOSIS — K219 Gastro-esophageal reflux disease without esophagitis: Secondary | ICD-10-CM | POA: Diagnosis not present

## 2015-10-05 DIAGNOSIS — R197 Diarrhea, unspecified: Secondary | ICD-10-CM | POA: Diagnosis not present

## 2015-10-05 LAB — COMPREHENSIVE METABOLIC PANEL
ALBUMIN: 4.6 g/dL (ref 3.5–5.2)
ALK PHOS: 92 U/L (ref 39–117)
ALT: 16 U/L (ref 0–35)
AST: 16 U/L (ref 0–37)
BILIRUBIN TOTAL: 0.3 mg/dL (ref 0.2–1.2)
BUN: 13 mg/dL (ref 6–23)
CO2: 24 mEq/L (ref 19–32)
Calcium: 9.6 mg/dL (ref 8.4–10.5)
Chloride: 102 mEq/L (ref 96–112)
Creatinine, Ser: 0.57 mg/dL (ref 0.40–1.20)
GFR: 119.68 mL/min (ref >60.00)
Glucose, Bld: 175 mg/dL — ABNORMAL HIGH (ref 70–99)
POTASSIUM: 4.3 meq/L (ref 3.5–5.1)
Sodium: 136 mEq/L (ref 135–145)
TOTAL PROTEIN: 7.5 g/dL (ref 6.0–8.3)

## 2015-10-05 LAB — CBC WITH DIFFERENTIAL/PLATELET
Basophils Absolute: 0.1 10*3/uL (ref 0.0–0.1)
Basophils Relative: 0.5 % (ref 0.0–3.0)
EOS PCT: 1.5 % (ref 0.0–5.0)
Eosinophils Absolute: 0.2 10*3/uL (ref 0.0–0.7)
HEMATOCRIT: 42.8 % (ref 36.0–46.0)
HEMOGLOBIN: 14.6 g/dL (ref 12.0–15.0)
LYMPHS PCT: 25 % (ref 12.0–46.0)
Lymphs Abs: 2.6 10*3/uL (ref 0.7–4.0)
MCHC: 34.1 g/dL (ref 30.0–36.0)
MCV: 93.2 fl (ref 78.0–100.0)
MONOS PCT: 5.9 % (ref 3.0–12.0)
Monocytes Absolute: 0.6 10*3/uL (ref 0.1–1.0)
Neutro Abs: 6.9 10*3/uL (ref 1.4–7.7)
Neutrophils Relative %: 67.1 % (ref 43.0–77.0)
Platelets: 291 10*3/uL (ref 150.0–400.0)
RBC: 4.59 Mil/uL (ref 3.87–5.11)
RDW: 12.5 % (ref 11.5–15.5)
WBC: 10.3 10*3/uL (ref 4.0–10.5)

## 2015-10-05 LAB — FERRITIN: Ferritin: 46.3 ng/mL (ref 10.0–291.0)

## 2015-10-05 LAB — VITAMIN B12: Vitamin B-12: 406 pg/mL (ref 211–911)

## 2015-10-05 LAB — IBC PANEL
IRON: 78 ug/dL (ref 42–145)
Saturation Ratios: 12.5 % — ABNORMAL LOW (ref 20.0–50.0)
TRANSFERRIN: 445 mg/dL — AB (ref 212.0–360.0)

## 2015-10-05 LAB — FOLATE: Folate: 17.7 ng/mL (ref >5.9)

## 2015-10-05 LAB — SEDIMENTATION RATE: SED RATE: 16 mm/h (ref 0–22)

## 2015-10-05 LAB — HIGH SENSITIVITY CRP: CRP, High Sensitivity: 4.57 mg/L (ref 0.000–5.000)

## 2015-10-05 MED ORDER — NA SULFATE-K SULFATE-MG SULF 17.5-3.13-1.6 GM/177ML PO SOLN: 1.0000 | Freq: Once | ORAL | Status: DC

## 2015-10-05 NOTE — Patient Instructions (Signed)

## 2015-10-05 NOTE — Progress Notes (Signed)
Barbara Dyer    [id number]    [birth year]  Primary Care Physician:KREMER,WILLIAM ALFRED, MD  Referring Physician: Libby Maw, MD [address] [facility] [place], [place] [address]  Chief complaint:   GERD , diarrhea HPI:  50 year old female with history of chronic diarrhea for the past 20 years here for new patient evaluation.  She was following up with a gastroenterologist and [place] who has retired and was also evaluated [place] and [employer].  She did not bring any of her past records,  Some information is available and care everywhere.  Per patient she has 10-12 bowel movements a day and also wakes up about 1-2 times a day mostly to urinate but also has a bowel movement at the same time.  She was started on Pentasa about 10 years ago for aphthous ulcers in the small bowel but she discontinued it as it not make any difference in her symptoms.  Per patient her last colonoscopy was about 5 years ago.  She also complains of epigastric pain and burning sensation with heartburn.  She is not taking any acid suppression medication.  She had a few episodes of bright red blood per rectum and reports having large hemorrhoids.  She was advised to start Colace but is currently not taking it. She does not take Imodium or Lomotil  reports avoiding lactose products,  Soda and artificial sweeteners.  No nausea or vomiting.  Her weights been stable    Outpatient Encounter Prescriptions as of 10/05/2015  Medication Sig  . acetaminophen (TYLENOL) 325 MG tablet Take 650 mg by mouth every 6 (six) hours as needed (pain).  [name] Kitchen aspirin 81 MG tablet Take 81 mg by mouth daily.  [name] Kitchen atorvastatin (LIPITOR) 20 MG tablet Take 20 mg by mouth daily.  [name] Kitchen BEE POLLEN PO Take 1 tablet by mouth 4 (four) times daily as needed (allergies).  [name] Kitchen BLACK COHOSH PO Take 1 capsule by mouth 2 (two) times daily.  . Flaxseed, Linseed, (FLAX SEED OIL PO) Take 1 capsule by mouth daily.  . Ginkgo  Biloba (GNP GINGKO BILOBA EXTRACT PO) Take 1 capsule by mouth 2 (two) times daily.   [name] Kitchen lisinopril (PRINIVIL,ZESTRIL) 10 MG tablet Take 10 mg by mouth daily.  . metFORMIN (GLUCOPHAGE-XR) 500 MG 24 hr tablet Take 1,000 mg by mouth every evening.   . Multiple Vitamin (MULTIVITAMIN) tablet Take 2 tablets by mouth daily.   . sodium chloride (OCEAN) 0.65 % SOLN nasal spray Place 1 spray into both nostrils as needed for congestion.  . [DISCONTINUED] Pseudoephedrine HCl (SUDAFED 24 HOUR PO) Take 1 tablet by mouth 4 (four) times daily as needed (for allergies).  . [DISCONTINUED] Wheat Dextrin (BENEFIBER DRINK MIX PO) Take 2 each by mouth 2 (two) times daily.   No facility-administered encounter medications on file as of 10/05/2015.    Allergies as of 10/05/2015 - Review Complete 10/05/2015  Allergen Reaction Noted  . Morphine and related Hives and Other (See Comments) 11/28/2013  . Prednisone Hives and Other (See Comments) 11/28/2013  . Lactose intolerance (gi) Diarrhea and Nausea Only 06/25/2015  . Humulin [insulin nph isophane & regular] Hives and Rash 11/28/2013  . Tape Dermatitis 11/28/2013    Past Medical History  Diagnosis Date  . Diabetes mellitus without complication ([place])   . Arthritis   . Crohn's colitis ([place]) 2009    Past Surgical History  Procedure Laterality Date  . Carpal tunnel release  Bilateral   . Back surgery    . Knee arthroscopy Bilateral   . Appendectomy    . Abdominal hysterectomy      plus tubes and ovaries 2nd surgery  . Breast surgery Left     bx  . Diagnostic laparoscopy      x3 for endometriosis  . Total knee arthroplasty Right 12/30/2014  . Total knee arthroplasty Right 12/30/2014    Procedure: TOTAL KNEE ARTHROPLASTY;  Surgeon: Kathryne Hitch, MD;  Location: [facility];  Service: Orthopedics;  Laterality: Right;    Family History  Problem Relation Age of Onset  . Diverticulosis Mother   . Diabetes Mother   . Diabetes Sister   . Kidney disease Paternal Aunt      Social History   Social History  . Marital Status: Single    Spouse Name: N/A  . Number of Children: 2  . Years of Education: N/A   Occupational History  . housewife    Social History Main Topics  . Smoking status: Current Some Day Smoker -- 1.00 packs/day for 25 years    Types: Cigarettes  . Smokeless tobacco: Never Used     Comment: 10-05-2015  . Alcohol Use: No  . Drug Use: No  . Sexual Activity: Not on file   Other Topics Concern  . Not on file   Social History Narrative      Review of systems: Review of Systems  Constitutional: Negative for fever and chills.  HENT: Negative.   Eyes: Negative for blurred vision.  Respiratory: Negative for cough, shortness of breath and wheezing.   Cardiovascular: Negative for chest pain and palpitations.  Gastrointestinal: as per HPI Genitourinary: Negative for dysuria, urgency, frequency and hematuria.  Musculoskeletal: Negative for myalgias, back pain and joint pain.  Skin: Negative for itching and rash.  Neurological: Negative for dizziness, tremors, focal weakness, seizures and loss of consciousness.  Endo/Heme/Allergies: Negative for environmental allergies.  Psychiatric/Behavioral: Negative for depression, suicidal ideas and hallucinations.  All other systems reviewed and are negative.   Physical Exam: There were no vitals filed for this visit. Gen:      No acute distress HEENT:  EOMI, sclera anicteric Neck:     No masses; no thyromegaly Lungs:    Clear to auscultation bilaterally; normal respiratory effort CV:         Regular rate and rhythm; no murmurs Abd:      + bowel sounds; soft, non-tender; no palpable masses, no distension Ext:    No edema; adequate peripheral perfusion Skin:      Warm and dry; no rash Neuro: alert and oriented x 3 Psych: normal mood and affect  Data Reviewed:  previous GI workup not available during this office visit   Assessment and Plan/Recommendations:  50 year old female with  history of chronic diarrhea and aphthous ulcers in small bowel concerning for Crohn's disease here to establish care  Patient appears to be a poor historian and given but don't have her past records available during this office visit it is unclear what workup she had and how she was managed in the past  Start Protonix '40mg'$  once daily and discussed antireflux measures  we will schedule for Colonoscopy with biopsies for evaluation of chronic diarrhea  also check TTG, O&P, fecal fat, ESR, IBD panel, CBC, CMP  return after procedure  we will obtain all her past records from [facility], [facility] and gastroenterologist in [place] , [place] [phone] Mon-Fri 8a-5p [phone] after 5p,  weekends, holidays

## 2015-10-06 LAB — FECAL LACTOFERRIN, QUANT: Lactoferrin: NEGATIVE

## 2015-10-06 LAB — OVA AND PARASITE EXAMINATION: OP: NONE SEEN

## 2015-10-07 ENCOUNTER — Other Ambulatory Visit: Payer: Self-pay

## 2015-10-07 LAB — IBD EXPANDED PANEL
ACCA: 0 U (ref 0–90)
ALCA: 0 units (ref 0–60)
AMCA: 2 units (ref 0–100)
ATYPICAL PANCA: NEGATIVE
GASCA: 0 U (ref 0–50)

## 2015-10-07 LAB — TISSUE TRANSGLUTAMINASE, IGA: Tissue Transglutaminase Ab, IgA: 1 U/mL (ref <4)

## 2015-10-08 LAB — FECAL FAT, QUALITATIVE: Fecal Fat Qualitative: NORMAL

## 2015-10-12 ENCOUNTER — Ambulatory Visit (AMBULATORY_SURGERY_CENTER): Payer: BLUE CROSS/BLUE SHIELD | Admitting: Gastroenterology

## 2015-10-12 ENCOUNTER — Encounter: Payer: Self-pay | Admitting: Gastroenterology

## 2015-10-12 VITALS — BP 151/100 | HR 93 | Temp 99.3°F | Resp 32 | Ht 60.0 in | Wt 200.0 lb

## 2015-10-12 DIAGNOSIS — R197 Diarrhea, unspecified: Secondary | ICD-10-CM | POA: Diagnosis present

## 2015-10-12 DIAGNOSIS — D120 Benign neoplasm of cecum: Secondary | ICD-10-CM

## 2015-10-12 DIAGNOSIS — K5010 Crohn's disease of large intestine without complications: Secondary | ICD-10-CM

## 2015-10-12 LAB — GLUCOSE, CAPILLARY
GLUCOSE-CAPILLARY: 166 mg/dL — AB (ref 65–99)
Glucose-Capillary: 181 mg/dL — ABNORMAL HIGH (ref 65–99)

## 2015-10-12 MED ORDER — SODIUM CHLORIDE 0.9 % IV SOLN: 500.0000 mL | INTRAVENOUS | Status: DC

## 2015-10-12 NOTE — Progress Notes (Signed)
Patient awakening,vss,report to rn 

## 2015-10-12 NOTE — Op Note (Addendum)
[facility]  [employer] [address]. [place], [address]   COLONOSCOPY PROCEDURE REPORT  PATIENT: Barbara Dyer, Barbara Dyer  MR#: [id number] BIRTHDATE: Feb 01, 1966 , 89  yrs. old GENDER: female ENDOSCOPIST: Harl Bowie, MD REFERRED [id number], Mortimer Fries, MD PROCEDURE DATE:  10/12/2015 PROCEDURE:   Colonoscopy, diagnostic, Colonoscopy with biopsy, and Colonoscopy with cold biopsy polypectomy First Screening Colonoscopy - Avg.  risk and is 50 yrs.  old or older - No.  Prior Negative Screening - Now for repeat screening. N/A  History of Adenoma - Now for follow-up colonoscopy & has been > or = to 3 yrs.  N/A  Polyps removed today? Yes ASA CLASS:   Class II INDICATIONS:Clinically significant diarrhea of unexplained origin and Colorectal Neoplasm Risk Assessment for this procedure is average risk. MEDICATIONS: Propofol 350 mg IV  DESCRIPTION OF PROCEDURE:   After the risks benefits and alternatives of the procedure were thoroughly explained, informed consent was obtained.  The digital rectal exam revealed no abnormalities of the rectum.   The LB [id number]  endoscope was introduced through the anus and advanced to the cecum, which was identified by both the appendix and ileocecal valve. No adverse events experienced.   The quality of the prep was good.  The instrument was then slowly withdrawn as the colon was fully examined. Estimated blood loss is zero unless otherwise noted in this procedure report.   COLON FINDINGS: There was mild diverticulosis noted throughout the entire examined colon.   Large internal hemorrhoids were found.   A flat polyp ranging between 3-41mm in size was found at the cecum.  A polypectomy was performed with cold forceps.  The resection was complete, the polyp tissue was completely retrieved and sent to histology. Random biopsies were obtained from TI and rest of colon mucosa. Retroflexed views revealed internal hemorrhoids. The time to cecum =  3.0 Withdrawal time = 12.0   The scope was withdrawn and the procedure completed. COMPLICATIONS: There were no immediate complications.  ENDOSCOPIC IMPRESSION: 1.   There was mild diverticulosis noted throughout the entire examined colon 2.   Large internal hemorrhoids  RECOMMENDATIONS: 1.  If the polyp(s) removed today are proven to be adenomatous (pre-cancerous) polyps, you will need a repeat colonoscopy in 5 years.  Otherwise you should continue to follow colorectal cancer screening guidelines for "routine risk" patients with colonoscopy in 10 years.  You will receive a letter within 1-2 weeks with the results of your biopsy as well as final recommendations.  Please call my office if you have not received a letter after 3 weeks. 2.  Await pathology results 3. Will schedule for banding of internal hemorrhoids  eSigned:  Harl Bowie, MD 10/12/2015 9:09 AM Revised: 10/12/2015 9:09 AM     PATIENT NAME:  Barbara Dyer MR#: [id number]

## 2015-10-12 NOTE — Progress Notes (Signed)
Called to room to assist during endoscopic procedure.  Patient ID and intended procedure confirmed with present staff. Received instructions for my participation in the procedure from the performing physician.  

## 2015-10-12 NOTE — Patient Instructions (Addendum)
YOU HAD AN ENDOSCOPIC PROCEDURE TODAY AT [facility]:   Refer to the procedure report that was given to you for any specific questions about what was found during the examination.  If the procedure report does not answer your questions, please call your gastroenterologist to clarify.  If you requested that your care partner not be given the details of your procedure findings, then the procedure report has been included in a sealed envelope for you to review at your convenience later.  YOU SHOULD EXPECT: Some feelings of bloating in the abdomen. Passage of more gas than usual.  Walking can help get rid of the air that was put into your GI tract during the procedure and reduce the bloating. If you had a lower endoscopy (such as a colonoscopy or flexible sigmoidoscopy) you may notice spotting of blood in your stool or on the toilet paper. If you underwent a bowel prep for your procedure, you may not have a normal bowel movement for a few days.  Please Note:  You might notice some irritation and congestion in your nose or some drainage.  This is from the oxygen used during your procedure.  There is no need for concern and it should clear up in a day or so.  SYMPTOMS TO REPORT IMMEDIATELY:   Following lower endoscopy (colonoscopy or flexible sigmoidoscopy):  Excessive amounts of blood in the stool  Significant tenderness or worsening of abdominal pains  Swelling of the abdomen that is new, acute  Fever of 100F or higher   For urgent or emergent issues, a gastroenterologist can be reached at any hour by calling [phone].   DIET: Your first meal following the procedure should be a small meal and then it is ok to progress to your normal diet. Heavy or fried foods are harder to digest and may make you feel nauseous or bloated.  Likewise, meals heavy in dairy and vegetables can increase bloating.  Drink plenty of fluids but you should avoid alcoholic beverages for 24  hours.  ACTIVITY:  You should plan to take it easy for the rest of today and you should NOT DRIVE or use heavy machinery until tomorrow (because of the sedation medicines used during the test).    FOLLOW UP: Our staff will call the number listed on your records the next business day following your procedure to check on you and address any questions or concerns that you may have regarding the information given to you following your procedure. If we do not reach you, we will leave a message.  However, if you are feeling well and you are not experiencing any problems, there is no need to return our call.  We will assume that you have returned to your regular daily activities without incident.  If any biopsies were taken you will be contacted by phone or by letter within the next 1-3 weeks.  Please call us at [phone] if you have not heard about the biopsies in 3 weeks.    SIGNATURES/CONFIDENTIALITY: You and/or your care partner have signed paperwork which will be entered into your electronic medical record.  These signatures attest to the fact that that the information above on your After Visit Summary has been reviewed and is understood.  Full responsibility of the confidentiality of this discharge information lies with you an  Large internal hemorrhoids Diverticulosis handout given

## 2015-10-13 ENCOUNTER — Telehealth: Payer: Self-pay

## 2015-10-13 ENCOUNTER — Telehealth: Payer: Self-pay | Admitting: Gastroenterology

## 2015-10-13 NOTE — Telephone Encounter (Signed)
Advised patient she will be contacted as soon as the schedule is out. Tentatively planning on 4/4 or 4/17 in the afternoon.

## 2015-10-13 NOTE — Telephone Encounter (Signed)
  Follow up Call-  Call back number 10/12/2015 11/28/2013  Post procedure Call Back phone  # [phone]  Permission to leave phone message Yes -     Patient was called for follow up after procedure on 10/12/2015. No answer at the number given for follow up. A message was left on the answering machine.

## 2015-10-19 ENCOUNTER — Encounter: Payer: Self-pay | Admitting: Gastroenterology

## 2015-10-19 ENCOUNTER — Other Ambulatory Visit: Payer: Self-pay | Admitting: Gastroenterology

## 2015-10-20 ENCOUNTER — Encounter: Payer: Self-pay | Admitting: Gastroenterology

## 2015-10-26 ENCOUNTER — Other Ambulatory Visit: Payer: Self-pay

## 2015-10-26 ENCOUNTER — Encounter: Payer: Self-pay | Admitting: Gastroenterology

## 2015-10-26 ENCOUNTER — Telehealth: Payer: Self-pay

## 2015-10-26 MED ORDER — MESALAMINE 1.2 G PO TBEC: 2.4000 g | DELAYED_RELEASE_TABLET | Freq: Every day | ORAL | Status: DC

## 2015-10-26 NOTE — Telephone Encounter (Signed)
I have left message for the patient to call back. See results letter.

## 2015-10-27 NOTE — Telephone Encounter (Signed)
Patient returned phone call. °

## 2015-10-27 NOTE — Telephone Encounter (Signed)
Patient advised of the Lialda prescription. Confirmed the pharmacy. Reviewed the biopsy results.

## 2015-11-10 ENCOUNTER — Telehealth: Payer: Self-pay | Admitting: Gastroenterology

## 2015-11-10 ENCOUNTER — Other Ambulatory Visit: Payer: Self-pay

## 2015-11-10 DIAGNOSIS — Z79899 Other long term (current) drug therapy: Secondary | ICD-10-CM

## 2015-11-10 NOTE — Telephone Encounter (Signed)
Not common side effects. Please advise her to stop Lialda. Ok to check BMP.

## 2015-11-10 NOTE — Telephone Encounter (Signed)
She is new on Lialda (for chronic colitis?) She has started having headaches and being nauseated all day. She says she itches all over but no rash. She thinks it is the Lialda. Please advise.

## 2015-11-10 NOTE — Telephone Encounter (Signed)
Patient is advised. She is open to changing medications if the Lialda is the cause of her current symptoms. She will come in for the lab tomorrow.

## 2015-11-11 ENCOUNTER — Other Ambulatory Visit (INDEPENDENT_AMBULATORY_CARE_PROVIDER_SITE_OTHER): Payer: BLUE CROSS/BLUE SHIELD

## 2015-11-11 DIAGNOSIS — Z79899 Other long term (current) drug therapy: Secondary | ICD-10-CM | POA: Diagnosis not present

## 2015-11-11 LAB — BASIC METABOLIC PANEL
BUN: 14 mg/dL (ref 6–23)
CHLORIDE: 101 meq/L (ref 96–112)
CO2: 23 meq/L (ref 19–32)
Calcium: 9.7 mg/dL (ref 8.4–10.5)
Creatinine, Ser: 0.61 mg/dL (ref 0.40–1.20)
GFR: 110.63 mL/min (ref >60.00)
GLUCOSE: 224 mg/dL — AB (ref 70–99)
POTASSIUM: 4.4 meq/L (ref 3.5–5.1)
Sodium: 134 mEq/L — ABNORMAL LOW (ref 135–145)

## 2015-11-15 NOTE — Telephone Encounter (Signed)
Her headaches are gone since stopping the Lialda. She is complaining of a rash under one breast. It is a fine red rash. She is a non-insulin dependent diabetic. Is there anything you would advise?

## 2015-11-15 NOTE — Telephone Encounter (Signed)
I have left message for the patient to call back. Is she any better off Lialda?

## 2015-11-15 NOTE — Telephone Encounter (Signed)
ok 

## 2015-12-14 ENCOUNTER — Encounter: Payer: Self-pay | Admitting: Gastroenterology

## 2015-12-14 ENCOUNTER — Ambulatory Visit (INDEPENDENT_AMBULATORY_CARE_PROVIDER_SITE_OTHER): Payer: Self-pay | Admitting: Gastroenterology

## 2015-12-14 ENCOUNTER — Ambulatory Visit: Payer: BLUE CROSS/BLUE SHIELD | Admitting: Gastroenterology

## 2015-12-14 VITALS — BP 138/78 | HR 88 | Ht 60.0 in

## 2015-12-14 DIAGNOSIS — K641 Second degree hemorrhoids: Secondary | ICD-10-CM

## 2015-12-14 NOTE — Patient Instructions (Signed)
HEMORRHOID BANDING PROCEDURE    FOLLOW-UP CARE   1. The procedure you have had should have been relatively painless since the banding of the area involved does not have nerve endings and there is no pain sensation.  The rubber band cuts off the blood supply to the hemorrhoid and the band may fall off as soon as 48 hours after the banding (the band may occasionally be seen in the toilet bowl following a bowel movement). You may notice a temporary feeling of fullness in the rectum which should respond adequately to plain Tylenol or Motrin.  2. Following the banding, avoid strenuous exercise that evening and resume full activity the next day.  A sitz bath (soaking in a warm tub) or bidet is soothing, and can be useful for cleansing the area after bowel movements.     3. To avoid constipation, take two tablespoons of natural wheat bran, natural oat bran, flax, Benefiber or any over the counter fiber supplement and increase your water intake to 7-8 glasses daily.    4. Unless you have been prescribed anorectal medication, do not put anything inside your rectum for two weeks: No suppositories, enemas, fingers, etc.  5. Occasionally, you may have more bleeding than usual after the banding procedure.  This is often from the untreated hemorrhoids rather than the treated one.  Don't be concerned if there is a tablespoon or so of blood.  If there is more blood than this, lie flat with your bottom higher than your head and apply an ice pack to the area. If the bleeding does not stop within a half an hour or if you feel faint, call our office at [phone] or go to the emergency room.  6. Problems are not common; however, if there is a substantial amount of bleeding, severe pain, chills, fever or difficulty passing urine (very rare) or other problems, you should call us at [phone] or report to the nearest emergency room.  7. Do not stay seated continuously for more than 2-3 hours for a day or two  after the procedure.  Tighten your buttock muscles 10-15 times every two hours and take 10-15 deep breaths every 1-2 hours.  Do not spend more than a few minutes on the toilet if you cannot empty your bowel; instead re-visit the toilet at a later time.  Your 2nd banding is scheduled on 01/20/2016 at 1:45pm

## 2015-12-15 ENCOUNTER — Encounter: Payer: Self-pay | Admitting: Gastroenterology

## 2015-12-15 MED ORDER — ELUXADOLINE 75 MG PO TABS: 75.0000 mg | ORAL_TABLET | Freq: Two times a day (BID) | ORAL | Status: DC

## 2015-12-15 NOTE — Progress Notes (Signed)
PROCEDURE NOTE: The patient presents with symptomatic grade II  hemorrhoids, requesting rubber band ligation of his/her hemorrhoidal disease.  All risks, benefits and alternative forms of therapy were described and informed consent was obtained.  In the Left Lateral Decubitus position anoscopic examination revealed grade II hemorrhoids in the Right anterior, right posterior and left lateral position(s).  The anorectum was pre-medicated with 0.125%NTG The decision was made to band the R anterior internal hemorrhoid, and the [place] was used to perform band ligation without complication.  Digital anorectal examination was then performed to assure proper positioning of the band, and to adjust the banded tissue as required.  The patient was discharged home without pain or other issues.  Dietary and behavioral recommendations were given and along with follow-up instructions.     The following adjunctive treatments were recommended: Benefiber 1 table spoon with meals three times daily Viberzi 75 mg twice daily given persistent increased bowel frequency. She stopped Lialda as per patient it made her diarrhea worse Avoid artificial sweeteners, high fructose drinks, soda and chewing gum  The patient will return in 4 weeks for  follow-up and possible additional banding as required. No complications were encountered and the patient tolerated the procedure well.

## 2015-12-17 ENCOUNTER — Telehealth: Payer: Self-pay | Admitting: Gastroenterology

## 2015-12-17 NOTE — Telephone Encounter (Signed)
Yes she can take Miralax 1 capful daily and titrate up or down based on symptoms

## 2015-12-17 NOTE — Telephone Encounter (Signed)
Patient is constipated and taking Benefiber. Her last BM was 12/14/15. That was the day she had her first banding. She is passing gas and feels bloated. She has Miralax on hand. Would it be okay to take Miralax?

## 2015-12-17 NOTE — Telephone Encounter (Signed)
Patient is instructed. 

## 2015-12-22 DIAGNOSIS — M6250 Muscle wasting and atrophy, not elsewhere classified, unspecified site: Secondary | ICD-10-CM | POA: Diagnosis not present

## 2016-01-20 ENCOUNTER — Encounter: Payer: Self-pay | Admitting: Gastroenterology

## 2016-01-20 ENCOUNTER — Ambulatory Visit (INDEPENDENT_AMBULATORY_CARE_PROVIDER_SITE_OTHER): Payer: BLUE CROSS/BLUE SHIELD | Admitting: Gastroenterology

## 2016-01-20 VITALS — BP 118/76 | HR 100 | Ht 59.5 in | Wt 205.0 lb

## 2016-01-20 DIAGNOSIS — R1084 Generalized abdominal pain: Secondary | ICD-10-CM

## 2016-01-20 DIAGNOSIS — K641 Second degree hemorrhoids: Secondary | ICD-10-CM

## 2016-01-20 NOTE — Patient Instructions (Addendum)
HEMORRHOID BANDING PROCEDURE    FOLLOW-UP CARE   1. The procedure you have had should have been relatively painless since the banding of the area involved does not have nerve endings and there is no pain sensation.  The rubber band cuts off the blood supply to the hemorrhoid and the band may fall off as soon as 48 hours after the banding (the band may occasionally be seen in the toilet bowl following a bowel movement). You may notice a temporary feeling of fullness in the rectum which should respond adequately to plain Tylenol or Motrin.  2. Following the banding, avoid strenuous exercise that evening and resume full activity the next day.  A sitz bath (soaking in a warm tub) or bidet is soothing, and can be useful for cleansing the area after bowel movements.     3. To avoid constipation, take two tablespoons of natural wheat bran, natural oat bran, flax, Benefiber or any over the counter fiber supplement and increase your water intake to 7-8 glasses daily.    4. Unless you have been prescribed anorectal medication, do not put anything inside your rectum for two weeks: No suppositories, enemas, fingers, etc.  5. Occasionally, you may have more bleeding than usual after the banding procedure.  This is often from the untreated hemorrhoids rather than the treated one.  Don't be concerned if there is a tablespoon or so of blood.  If there is more blood than this, lie flat with your bottom higher than your head and apply an ice pack to the area. If the bleeding does not stop within a half an hour or if you feel faint, call our office at [phone] or go to the emergency room.  6. Problems are not common; however, if there is a substantial amount of bleeding, severe pain, chills, fever or difficulty passing urine (very rare) or other problems, you should call us at [phone] or report to the nearest emergency room.  7. Do not stay seated continuously for more than 2-3 hours for a day or two  after the procedure.  Tighten your buttock muscles 10-15 times every two hours and take 10-15 deep breaths every 1-2 hours.  Do not spend more than a few minutes on the toilet if you cannot empty your bowel; instead re-visit the toilet at a later time.   Your 3rd banding is scheduled on 03/20/2016 at 4pm    We have scheduled you for a CT Enterography at [facility] on 01/26/2016 at 1:45pm to arrive at [place] will need to go to the basement today for labs

## 2016-01-20 NOTE — Progress Notes (Signed)
PROCEDURE NOTE: The patient presents with symptomatic grade II  hemorrhoids, requesting rubber band ligation of his/her hemorrhoidal disease.  All risks, benefits and alternative forms of therapy were described and informed consent was obtained.   The anorectum was pre-medicated with 0.125% nitroglycerine The decision was made to band the R posterior internal hemorrhoid, and the [place] was used to perform band ligation without complication.  Digital anorectal examination was then performed to assure proper positioning of the band, and to adjust the banded tissue as required.  The patient was discharged home without pain or other issues.  Dietary and behavioral recommendations were given and along with follow-up instructions.     Patient complained of intermittent abdominal pain in the RLQ, given her history of small bowel ulceration and possible crohn's disease, will schedule for CT enterography for further characterization  The patient will return in 2-4 weeks for  follow-up and possible additional banding as required. No complications were encountered and the patient tolerated the procedure well.   Damaris Hippo , MD [phone] Mon-Fri 8a-5p [phone] after 5p, weekends, holidays

## 2016-01-21 ENCOUNTER — Other Ambulatory Visit (INDEPENDENT_AMBULATORY_CARE_PROVIDER_SITE_OTHER): Payer: BLUE CROSS/BLUE SHIELD

## 2016-01-21 DIAGNOSIS — R1084 Generalized abdominal pain: Secondary | ICD-10-CM | POA: Diagnosis not present

## 2016-01-21 DIAGNOSIS — M6250 Muscle wasting and atrophy, not elsewhere classified, unspecified site: Secondary | ICD-10-CM | POA: Diagnosis not present

## 2016-01-21 DIAGNOSIS — K641 Second degree hemorrhoids: Secondary | ICD-10-CM | POA: Diagnosis not present

## 2016-01-21 LAB — BASIC METABOLIC PANEL
BUN: 11 mg/dL (ref 6–23)
CALCIUM: 9.4 mg/dL (ref 8.4–10.5)
CO2: 24 meq/L (ref 19–32)
CREATININE: 0.57 mg/dL (ref 0.40–1.20)
Chloride: 101 mEq/L (ref 96–112)
GFR: 119.54 mL/min (ref >60.00)
Glucose, Bld: 210 mg/dL — ABNORMAL HIGH (ref 70–99)
Potassium: 4.2 mEq/L (ref 3.5–5.1)
Sodium: 138 mEq/L (ref 135–145)

## 2016-01-25 DIAGNOSIS — R319 Hematuria, unspecified: Secondary | ICD-10-CM | POA: Diagnosis not present

## 2016-01-25 DIAGNOSIS — I10 Essential (primary) hypertension: Secondary | ICD-10-CM | POA: Diagnosis not present

## 2016-01-25 DIAGNOSIS — E782 Mixed hyperlipidemia: Secondary | ICD-10-CM | POA: Diagnosis not present

## 2016-01-25 DIAGNOSIS — E119 Type 2 diabetes mellitus without complications: Secondary | ICD-10-CM | POA: Diagnosis not present

## 2016-01-25 DIAGNOSIS — R252 Cramp and spasm: Secondary | ICD-10-CM | POA: Diagnosis not present

## 2016-01-25 DIAGNOSIS — R0683 Snoring: Secondary | ICD-10-CM | POA: Diagnosis not present

## 2016-01-25 DIAGNOSIS — E039 Hypothyroidism, unspecified: Secondary | ICD-10-CM | POA: Diagnosis not present

## 2016-01-26 ENCOUNTER — Ambulatory Visit (INDEPENDENT_AMBULATORY_CARE_PROVIDER_SITE_OTHER)
Admission: RE | Admit: 2016-01-26 | Discharge: 2016-01-26 | Disposition: A | Discharge status: Home / Self Care | Payer: BLUE CROSS/BLUE SHIELD | Source: Ambulatory Visit | Attending: Gastroenterology | Admitting: Gastroenterology

## 2016-01-26 DIAGNOSIS — K641 Second degree hemorrhoids: Secondary | ICD-10-CM | POA: Diagnosis not present

## 2016-01-26 DIAGNOSIS — K5790 Diverticulosis of intestine, part unspecified, without perforation or abscess without bleeding: Secondary | ICD-10-CM | POA: Diagnosis not present

## 2016-01-26 MED ORDER — IOPAMIDOL (ISOVUE-300) INJECTION 61%
100.0000 mL | Freq: Once | INTRAVENOUS | Status: AC | PRN
  Administered 2016-01-26: 100 mL via INTRAVENOUS

## 2016-02-21 DIAGNOSIS — M6250 Muscle wasting and atrophy, not elsewhere classified, unspecified site: Secondary | ICD-10-CM | POA: Diagnosis not present

## 2016-03-09 DIAGNOSIS — R319 Hematuria, unspecified: Secondary | ICD-10-CM | POA: Diagnosis not present

## 2016-03-20 ENCOUNTER — Ambulatory Visit (INDEPENDENT_AMBULATORY_CARE_PROVIDER_SITE_OTHER): Payer: BLUE CROSS/BLUE SHIELD | Admitting: Gastroenterology

## 2016-03-20 VITALS — BP 120/62 | HR 80 | Ht 60.0 in | Wt 194.0 lb

## 2016-03-20 DIAGNOSIS — K641 Second degree hemorrhoids: Secondary | ICD-10-CM | POA: Diagnosis not present

## 2016-03-20 NOTE — Patient Instructions (Signed)

## 2016-03-20 NOTE — Progress Notes (Signed)
PROCEDURE NOTE: The patient presents with symptomatic grade II  hemorrhoids, requesting rubber band ligation of his/her hemorrhoidal disease.  All risks, benefits and alternative forms of therapy were described and informed consent was obtained.   The anorectum was pre-medicated with 0.125% nitroglycerine The decision was made to band the left lateral internal hemorrhoid, and the [place] was used to perform band ligation without complication.  Digital anorectal examination was then performed to assure proper positioning of the band, and to adjust the banded tissue as required.  The patient was discharged home without pain or other issues.  Dietary and behavioral recommendations were given and along with follow-up instructions.     The following adjunctive treatments were recommended: VSL #3 112 b units daily  No complications were encountered and the patient tolerated the procedure well.  She is currently taking Benefiber No longer having episodes of diarrhea She has discontinued viberzi She is doing well overall and denies any nausea, vomiting, abdominal pain, melena or bright red blood per rectum Follow up as needed  K. Denzil Magnuson , MD [phone] Mon-Fri 8a-5p [phone] after 5p, weekends, holidays

## 2016-03-21 DIAGNOSIS — R3129 Other microscopic hematuria: Secondary | ICD-10-CM | POA: Diagnosis not present

## 2016-03-21 DIAGNOSIS — R319 Hematuria, unspecified: Secondary | ICD-10-CM | POA: Diagnosis not present

## 2016-03-22 DIAGNOSIS — M6250 Muscle wasting and atrophy, not elsewhere classified, unspecified site: Secondary | ICD-10-CM | POA: Diagnosis not present

## 2016-03-29 DIAGNOSIS — G4733 Obstructive sleep apnea (adult) (pediatric): Secondary | ICD-10-CM | POA: Diagnosis not present

## 2016-04-22 DIAGNOSIS — M6250 Muscle wasting and atrophy, not elsewhere classified, unspecified site: Secondary | ICD-10-CM | POA: Diagnosis not present

## 2016-04-25 ENCOUNTER — Telehealth: Payer: Self-pay | Admitting: Gastroenterology

## 2016-04-25 NOTE — Telephone Encounter (Signed)
Okay thanks, appears to be unrelated to hemorrhoidal band ligation done greater than 4 weeks ago. She may have developed an anal fissure. We'll evaluate during office visit.

## 2016-04-25 NOTE — Telephone Encounter (Signed)
Barbara Dyer had her 2nd banding on 03/20/16. She was fine for the first 2 days. She then began having discomfort at her rectum. She did sitz bathes and cool compresses. After 2 weeks she used a suppository. This helped for a day. Now she is worse than before.

## 2016-04-25 NOTE — Telephone Encounter (Signed)
Spoke with the patient. She agrees to an appointment for evaluation.

## 2016-04-27 ENCOUNTER — Encounter (INDEPENDENT_AMBULATORY_CARE_PROVIDER_SITE_OTHER): Payer: Self-pay

## 2016-04-27 ENCOUNTER — Ambulatory Visit (INDEPENDENT_AMBULATORY_CARE_PROVIDER_SITE_OTHER): Payer: BLUE CROSS/BLUE SHIELD | Admitting: Gastroenterology

## 2016-04-27 ENCOUNTER — Encounter: Payer: Self-pay | Admitting: Gastroenterology

## 2016-04-27 VITALS — BP 146/68 | HR 96 | Ht 59.5 in | Wt 194.4 lb

## 2016-04-27 DIAGNOSIS — K602 Anal fissure, unspecified: Secondary | ICD-10-CM | POA: Diagnosis not present

## 2016-04-27 DIAGNOSIS — K6289 Other specified diseases of anus and rectum: Secondary | ICD-10-CM

## 2016-04-27 DIAGNOSIS — K625 Hemorrhage of anus and rectum: Secondary | ICD-10-CM | POA: Insufficient documentation

## 2016-04-27 MED ORDER — DILTIAZEM GEL 2 %: 1.0000 "application " | Freq: Two times a day (BID) | CUTANEOUS | 0 refills | Status: DC

## 2016-04-27 NOTE — Progress Notes (Signed)
     04/27/2016 Barbara Dyer [id number] December 03, 1965   History of Present Illness:  This is a 50 year old female who is known to Dr. Silverio Decamp.  She had a hemorrhoid banding about 5 weeks ago, on July 10. She is here today with complaints of rectal pain and bleeding. She tells me that a day or 2 after the hemorrhoid banding she passed the band when it came off and that is when she developed the symptoms. She's been trying to use Preparation H and suppositories with sitz baths, etc. since then but finally called here 2 days ago and was given this appointment.  Current Medications, Allergies, Past Medical History, Past Surgical History, Family History and Social History were reviewed in [employer] record.   Physical Exam: BP (!) 146/68 (BP Location: Left Arm, Patient Position: Sitting, Cuff Size: Normal)   Pulse 96   Ht 4' 11.5" (1.511 m)   Wt 194 lb 6 oz (88.2 kg)   BMI 38.60 kg/m  General: Well developed white female in no acute distress Head: Normocephalic and atraumatic Eyes:  Sclerae anicteric, conjunctiva pink  Ears: Normal auditory acuity Rectal:  External hemorrhoids noted.  DRE was painful.  No definite abnormalities felt.  Anoscopy attempted, but was painful.  No definite ulcer, etc seen. Musculoskeletal: Symmetrical with no gross deformities  Extremities: No edema  Neurological: Alert oriented x 4, grossly non-focal Psychological:  Alert and cooperative. Normal mood and affect  Assessment and Recommendations: -Rectal pain and bleeding:  Suspect anal fissure.  Attempted anoscopy and no definite sign of ulcer noted on exam.  Will treat empirically with diltiazem gel for 6-8 weeks.  Can use Recticare as well for discomfort relief prn.  Avoid aggressive hygiene and keep stools soft.  We will see her back in about 4 weeks and if she is not having much improvement by that time then may try to repeat anoscopy and then possibly switch her to nitroglycerin. She will  call back in the interim if she has worsening symptoms or develops fever, chills, etc.

## 2016-04-27 NOTE — Patient Instructions (Addendum)
We have sent the following medications to your pharmacy for you to pick up at your convenience:  Diltiazem gel three times a day  For 8 weeks insert up into the first knuckle.   Also use over the counter  Reticare with Lidocaine 5%.       Call back and schedule an appointment in 4-6 weeks or sooner if you are still having pain.   Continue Sitz baths, keep stools soft.   Anal Fissure, Adult An anal fissure is a small tear or crack in the skin around the anus. Bleeding from a fissure usually stops on its own within a few minutes. However, bleeding will often occur again with each bowel movement until the crack heals. CAUSES This condition may be caused by:  Passing large, hard stool (feces).  Frequent diarrhea.  Constipation.  Inflammatory bowel disease (Crohn disease or ulcerative colitis).  Infections.  Anal sex. SYMPTOMS Symptoms of this condition include:  Bleeding from the rectum.  Small amounts of blood seen on your stool, on toilet paper, or in the toilet after a bowel movement.  Painful bowel movements.  Itching or irritation around the anus. DIAGNOSIS A health care provider may diagnose this condition by closely examining the anal area. An anal fissure can usually be seen with careful inspection. In some cases, a rectal exam may be performed, or a short tube (anoscope) may be used to examine the anal canal. TREATMENT Treatment for this condition may include:  Taking steps to avoid constipation. This may include making changes to your diet, such as increasing your intake of fiber or fluid.  Taking fiber supplements. These supplements can soften your stool to help make bowel movements easier. Your health care provider may also prescribe a stool softener if your stool is often hard.  Taking sitz baths. This may help to heal the tear.  Using medicated creams or ointments. These may be prescribed to lessen discomfort. HOME CARE INSTRUCTIONS Eating and  Drinking  Avoid foods that may be constipating, such as bananas and dairy products.  Drink enough fluid to keep your urine clear or pale yellow.  Maintain a diet that is high in fruits, whole grains, and vegetables. General Instructions  Keep the anal area as clean and dry as possible.  Take sitz baths as told by your health care provider. Do not use soap in the sitz baths.  Take over-the-counter and prescription medicines only as told by your health care provider.  Use creams or ointments only as told by your health care provider.  Keep all follow-up visits as told by your health care provider. This is important. SEEK MEDICAL CARE IF:  You have more bleeding.  You have a fever.  You have diarrhea that is mixed with blood.  You continue to have pain.  Your problem is getting worse rather than better.   This information is not intended to replace advice given to you by your health care provider. Make sure you discuss any questions you have with your health care provider.   Document Released: 08/28/2005 Document Revised: 05/19/2015 Document Reviewed: 11/23/2014 Elsevier Interactive Patient Education [employer].

## 2016-04-27 NOTE — Progress Notes (Signed)
Reviewed and agree with documentation and assessment and plan. K. Veena Nandigam , MD   

## 2016-05-11 ENCOUNTER — Telehealth: Payer: Self-pay | Admitting: Gastroenterology

## 2016-05-11 MED ORDER — DILTIAZEM GEL 2 %: CUTANEOUS | 0 refills | Status: DC

## 2016-05-11 NOTE — Telephone Encounter (Signed)
Patient reports that she is feeling better.  She is having some rectal bleeding, but overall is better.  She reports that she is almost out of her diltiazem. Was just prescribed on 04/28/16.  I asked her to tell me how she is taking it. She indicated she is using enough to fill up to her 1st knuckle.  I explained that she should only use a pea size amount and insert it to the 1st knuckle.  Refill sent in.  She does report a slight HA.  She is advised that should improve with pea size amount.

## 2016-05-23 DIAGNOSIS — M6250 Muscle wasting and atrophy, not elsewhere classified, unspecified site: Secondary | ICD-10-CM | POA: Diagnosis not present

## 2016-06-22 DIAGNOSIS — M6250 Muscle wasting and atrophy, not elsewhere classified, unspecified site: Secondary | ICD-10-CM | POA: Diagnosis not present

## 2016-06-30 DIAGNOSIS — Z96651 Presence of right artificial knee joint: Secondary | ICD-10-CM | POA: Diagnosis not present

## 2016-06-30 DIAGNOSIS — M25562 Pain in left knee: Secondary | ICD-10-CM | POA: Diagnosis not present

## 2016-07-04 IMAGING — CR DG KNEE 1-2V PORT*R*
2 series · 2 of 2 positions shown · non-contrast
Comparison: None.

CLINICAL DATA: Post total knee replacement.

EXAM:
PORTABLE RIGHT KNEE - 1-2 VIEW

[AP]
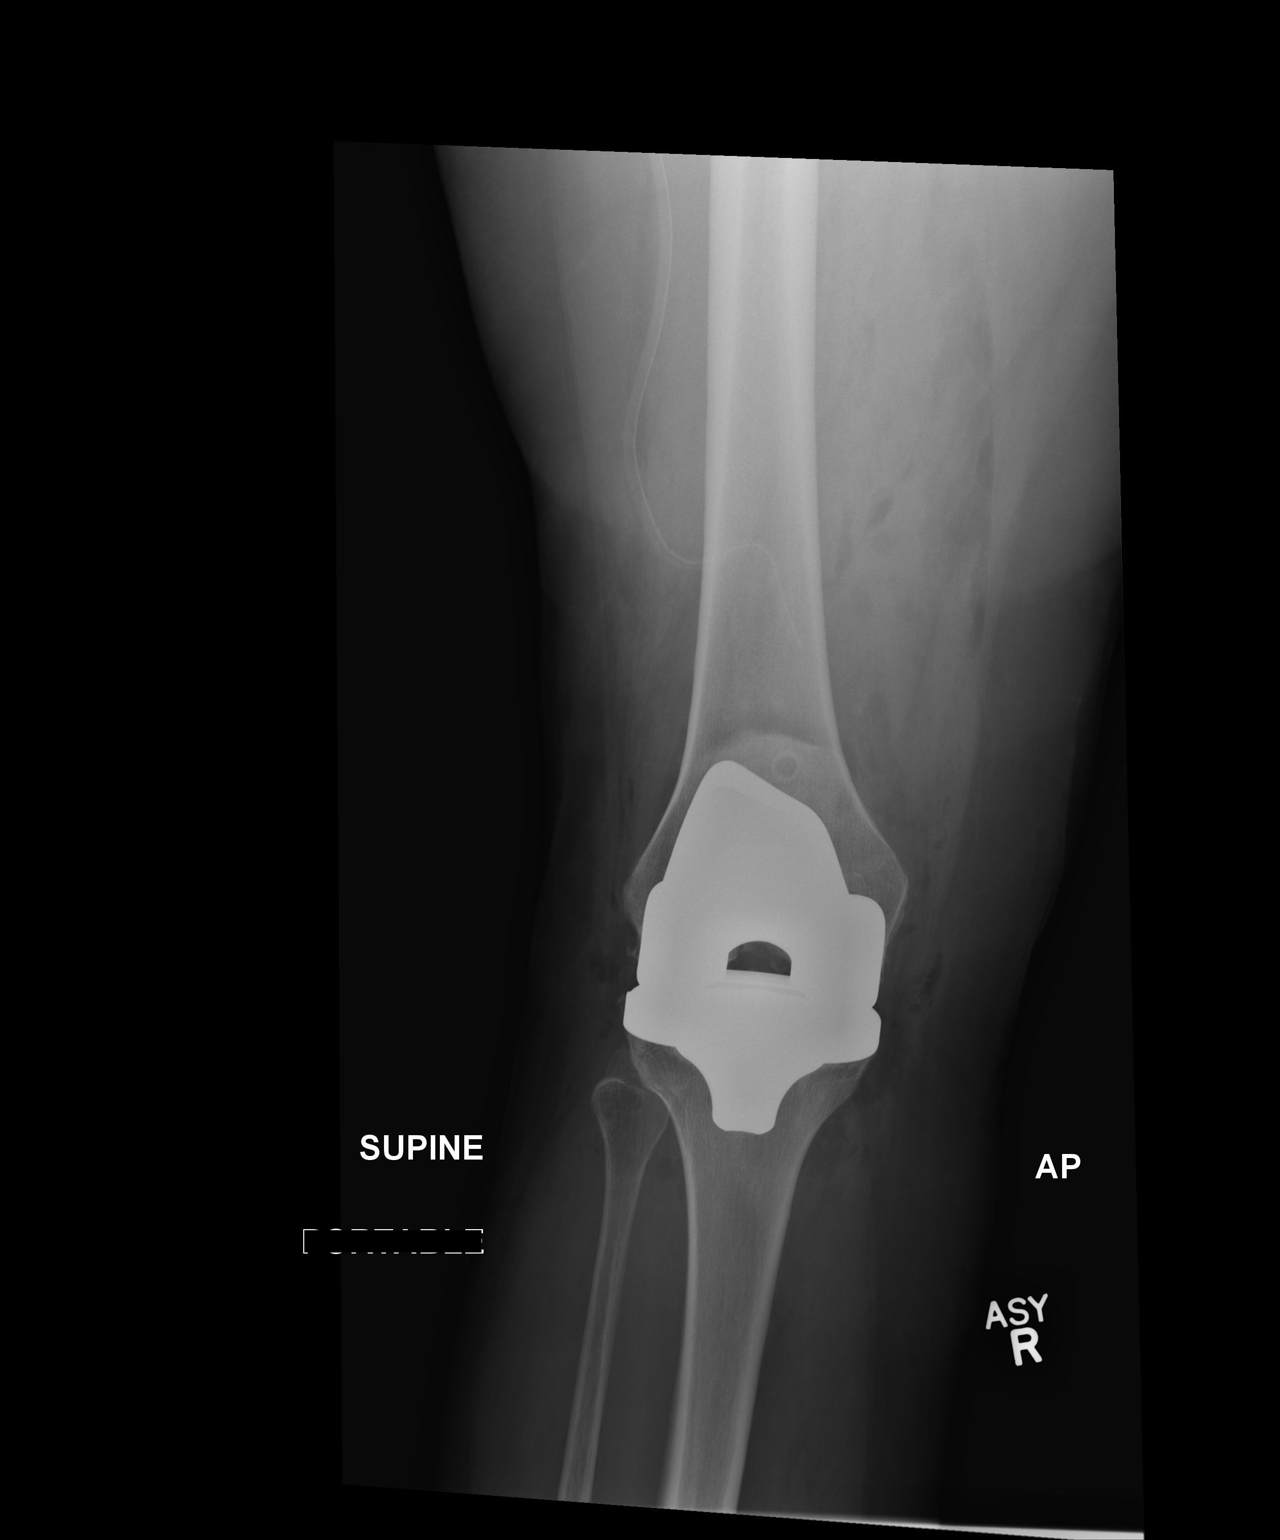

[xtable lateral]
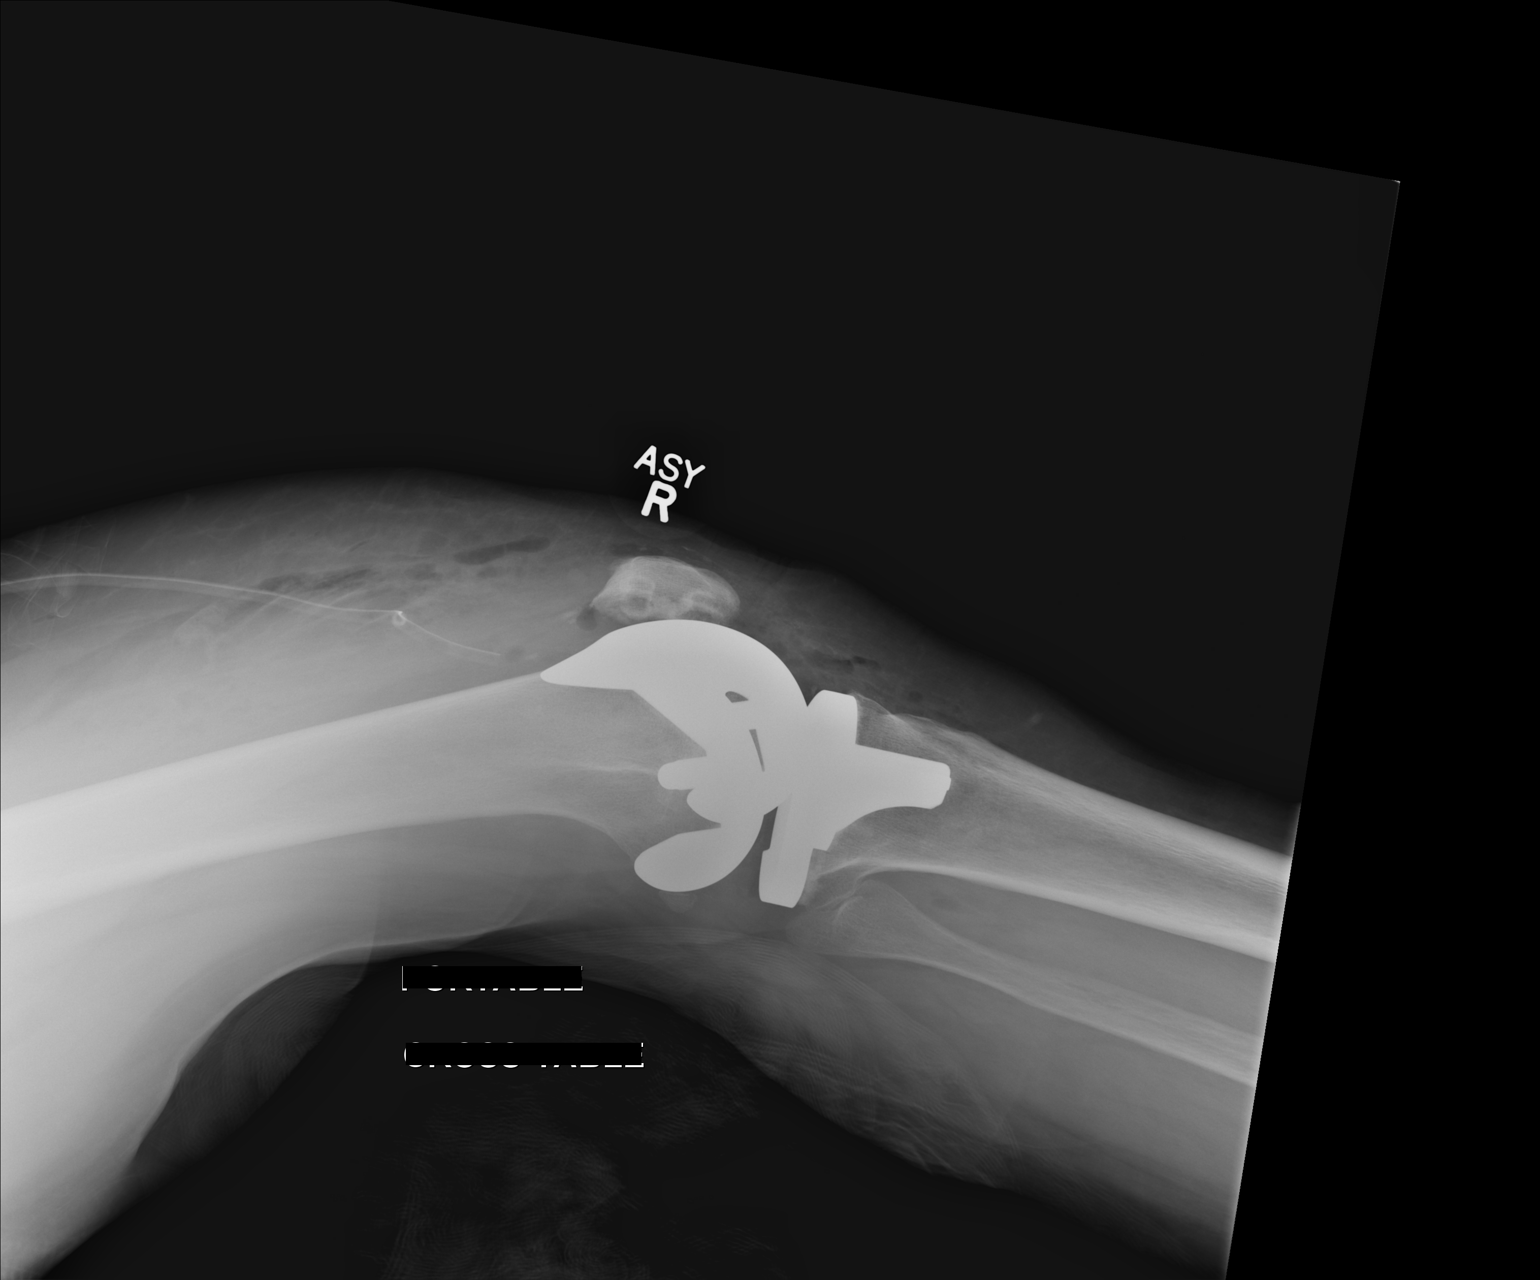

[2 of 2 positions shown; findings below may reference images not displayed]

FINDINGS: Changes of right total knee replacement. No hardware or bony
complicating feature. Soft tissue and joint space gas. Soft tissue
drain in place.
IMPRESSION: Right knee replacement without complicating feature.

## 2016-07-23 DIAGNOSIS — M6250 Muscle wasting and atrophy, not elsewhere classified, unspecified site: Secondary | ICD-10-CM | POA: Diagnosis not present

## 2016-08-22 DIAGNOSIS — M6250 Muscle wasting and atrophy, not elsewhere classified, unspecified site: Secondary | ICD-10-CM | POA: Diagnosis not present

## 2016-09-22 DIAGNOSIS — M6250 Muscle wasting and atrophy, not elsewhere classified, unspecified site: Secondary | ICD-10-CM | POA: Diagnosis not present

## 2016-10-20 ENCOUNTER — Encounter (HOSPITAL_COMMUNITY): Payer: Self-pay

## 2016-10-20 ENCOUNTER — Telehealth: Payer: Self-pay | Admitting: Gastroenterology

## 2016-10-20 ENCOUNTER — Emergency Department (HOSPITAL_COMMUNITY)
Admission: EM | Admit: 2016-10-20 | Discharge: 2016-10-20 | Disposition: A | Discharge status: Home / Self Care | Payer: BLUE CROSS/BLUE SHIELD | Attending: Emergency Medicine | Admitting: Emergency Medicine

## 2016-10-20 DIAGNOSIS — Z7982 Long term (current) use of aspirin: Secondary | ICD-10-CM | POA: Insufficient documentation

## 2016-10-20 DIAGNOSIS — F17210 Nicotine dependence, cigarettes, uncomplicated: Secondary | ICD-10-CM | POA: Diagnosis not present

## 2016-10-20 DIAGNOSIS — I10 Essential (primary) hypertension: Secondary | ICD-10-CM | POA: Diagnosis not present

## 2016-10-20 DIAGNOSIS — E119 Type 2 diabetes mellitus without complications: Secondary | ICD-10-CM | POA: Insufficient documentation

## 2016-10-20 DIAGNOSIS — M62830 Muscle spasm of back: Secondary | ICD-10-CM | POA: Diagnosis not present

## 2016-10-20 DIAGNOSIS — Z96651 Presence of right artificial knee joint: Secondary | ICD-10-CM | POA: Diagnosis not present

## 2016-10-20 DIAGNOSIS — R110 Nausea: Secondary | ICD-10-CM | POA: Insufficient documentation

## 2016-10-20 DIAGNOSIS — M549 Dorsalgia, unspecified: Secondary | ICD-10-CM | POA: Diagnosis present

## 2016-10-20 LAB — COMPREHENSIVE METABOLIC PANEL
ALT: 23 U/L (ref 14–54)
AST: 25 U/L (ref 15–41)
Albumin: 4.3 g/dL (ref 3.5–5.0)
Alkaline Phosphatase: 83 U/L (ref 38–126)
Anion gap: 13 (ref 5–15)
BUN: 11 mg/dL (ref 6–20)
CO2: 23 mmol/L (ref 22–32)
Calcium: 9.6 mg/dL (ref 8.9–10.3)
Chloride: 101 mmol/L (ref 101–111)
Creatinine, Ser: 0.54 mg/dL (ref 0.44–1.00)
GFR calc Af Amer: >60 mL/min (ref >60)
GFR calc non Af Amer: >60 mL/min (ref >60)
Glucose, Bld: 124 mg/dL — ABNORMAL HIGH (ref 65–99)
Potassium: 4 mmol/L (ref 3.5–5.1)
Sodium: 137 mmol/L (ref 135–145)
Total Bilirubin: 0.5 mg/dL (ref 0.3–1.2)
Total Protein: 6.9 g/dL (ref 6.5–8.1)

## 2016-10-20 LAB — CBC
HCT: 43.7 % (ref 36.0–46.0)
Hemoglobin: 15.5 g/dL — ABNORMAL HIGH (ref 12.0–15.0)
MCH: 33 pg (ref 26.0–34.0)
MCHC: 35.5 g/dL (ref 30.0–36.0)
MCV: 93 fL (ref 78.0–100.0)
Platelets: 196 10*3/uL (ref 150–400)
RBC: 4.7 MIL/uL (ref 3.87–5.11)
RDW: 12.5 % (ref 11.5–15.5)
WBC: 7.9 10*3/uL (ref 4.0–10.5)

## 2016-10-20 LAB — URINALYSIS, ROUTINE W REFLEX MICROSCOPIC
Bilirubin Urine: NEGATIVE
Glucose, UA: NEGATIVE mg/dL
Ketones, ur: 20 mg/dL — AB
Leukocytes, UA: NEGATIVE
Nitrite: NEGATIVE
Protein, ur: NEGATIVE mg/dL
Specific Gravity, Urine: 1.013 (ref 1.005–1.030)
pH: 5 (ref 5.0–8.0)

## 2016-10-20 LAB — LIPASE, BLOOD: Lipase: 24 U/L (ref 11–51)

## 2016-10-20 MED ORDER — METHOCARBAMOL 500 MG PO TABS: 500.0000 mg | ORAL_TABLET | Freq: Every evening | ORAL | 0 refills | Status: DC | PRN

## 2016-10-20 MED ORDER — ONDANSETRON HCL 4 MG PO TABS: 4.0000 mg | ORAL_TABLET | Freq: Three times a day (TID) | ORAL | 0 refills | Status: DC | PRN

## 2016-10-20 MED ORDER — ONDANSETRON HCL 4 MG/2ML IJ SOLN
4.0000 mg | Freq: Once | INTRAMUSCULAR | Status: AC
  Administered 2016-10-20: 4 mg via INTRAVENOUS
  Filled 2016-10-20: qty 2

## 2016-10-20 MED ORDER — SODIUM CHLORIDE 0.9 % IV BOLUS (SEPSIS)
1000.0000 mL | Freq: Once | INTRAVENOUS | Status: AC
  Administered 2016-10-20: 1000 mL via INTRAVENOUS

## 2016-10-20 NOTE — Telephone Encounter (Signed)
Note made in error

## 2016-10-20 NOTE — ED Triage Notes (Signed)
CO of nausea, no vomiting that has been going on for a week. CO of a head ache, back pain, and some abdominal pain. No fever at this time.

## 2016-10-20 NOTE — ED Notes (Signed)
Papers reviewed with husband, patient helped to bathroom and shown how to get out

## 2016-10-20 NOTE — Discharge Instructions (Signed)
As discussed, please remain hydrated and start reintroducing foods slowly with clear broths and crackers. The medication prescribed is to help you with nausea. Take it as needed if you experience nausea.  Please follow-up with your gastroenterologist regarding your recent colonoscopy and findings.  Return to the emergency department if your symptoms worsen, pain increases, he experience of fever, chills, vomiting and are unable to control your vomiting with the medication prescribed, or if she experiences any new concerning symptoms.

## 2016-10-20 NOTE — ED Provider Notes (Signed)
[facility] DEPT Provider Note   CSN: [id number] Arrival date & time: 10/20/16  [id number]     History   Chief Complaint Chief Complaint  Patient presents with  . Nausea  . Abdominal Pain    HPI Barbara Dyer is a 51 y.o. female with a history of Crohn's presenting with nausea, suprapubic pain, and tension-type headache. She states that she has had these headaches in the past but without the photophobia and doesn't take Tylenol or ibuprofen as it makes her nauseated and gives her stomach pain. She has tried lavender aroma therapy without success. The headache is in the back of her head and goes down into her shoulder blades along the trapezius muscle. She states that her abdominal discomfort moves to the back and she feels this discomfort all along her spine and back. It essentially moves from the back of her head to her lower back. She endorses nausea at this time but no vomiting, she has had streaks of dark blood in her stool which she has been followed by her gastroenterologist Dr. Denton Brick. She saw her 2 months ago and he did a colonoscopy and she was diagnosed with colitis. She was prescribed medications but she is not taking them because they give her an upset stomach. She also states that they told her that she had a precancerous lesion. No history of kidney stones. She also endorses dysuria. No vaginal bleeding, discharge. She reports a complete hysterectomy with bilateral oophorectomy.  HPI  Past Medical History:  Diagnosis Date  . Arthritis   . Crohn's colitis ([place]) 2009  . Diabetes mellitus without complication ([place])   . Hyperlipemia   . Hypertension     Patient Active Problem List   Diagnosis Date Noted  . Anal fissure 04/27/2016  . Rectal pain 04/27/2016  . Rectal bleeding 04/27/2016  . 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 ABDOMINAL HYSTERECTOMY     plus tubes and ovaries 2nd surgery  . APPENDECTOMY     . BACK SURGERY    . BREAST SURGERY Left    bx  . CARPAL TUNNEL RELEASE Bilateral   . CHOLECYSTECTOMY    . DIAGNOSTIC LAPAROSCOPY     x3 for endometriosis  . KNEE ARTHROSCOPY Bilateral   . TOTAL KNEE ARTHROPLASTY Right 12/30/2014  . TOTAL KNEE ARTHROPLASTY Right 12/30/2014   Procedure: TOTAL KNEE ARTHROPLASTY;  Surgeon: Kathryne Hitch, MD;  Location: [facility];  Service: Orthopedics;  Laterality: Right;    OB History    No data available       Home Medications    Prior to Admission medications   Medication Sig Start Date End Date Taking? Authorizing Provider  aspirin 81 MG tablet Take 81 mg by mouth every other day.    Yes Historical Provider, MD  Barberry-Oreg Grape-Goldenseal (BERBERINE COMPLEX PO) Take 1 capsule by mouth daily.   Yes Historical Provider, MD  diltiazem 2 % GEL Apply a pea size amount into rectum TID 05/11/16  Yes Mackinsey Pelland D Zehr, PA-C  guaiFENesin (MUCINEX) 600 MG 12 hr tablet Take 600 mg by mouth 2 (two) times daily as needed for cough.   Yes Historical Provider, MD  Magnesium 250 MG TABS Take 500 mg by mouth daily.    Yes Historical Provider, MD  Melatonin 5 MG TABS Take 5 mg by mouth at bedtime as needed.   Yes Historical Provider, MD  Multiple Vitamin (MULTIVITAMIN) tablet Take 2 tablets by  mouth daily.    Yes Historical Provider, MD  Nutritional Supplements (MENOPAUSE FORMULA PO) Take 1 tablet by mouth daily.   Yes Historical Provider, MD  OVER THE COUNTER MEDICATION Take 2 capsules by mouth daily. "Vita Fusion-Vitamin D3/Bone health/Immune System" supplement   Yes Historical Provider, MD  sodium chloride (OCEAN) 0.65 % SOLN nasal spray Place 1 spray into both nostrils as needed for congestion.   Yes Historical Provider, MD  Wheat Dextrin (BENEFIBER DRINK MIX) PACK Take 1 packet by mouth daily.   Yes Historical Provider, MD  methocarbamol (ROBAXIN) 500 MG tablet Take 1 tablet (500 mg total) by mouth at bedtime as needed for muscle spasms. 10/20/16   Emeline General, PA-C  ondansetron (ZOFRAN) 4 MG tablet Take 1 tablet (4 mg total) by mouth every 8 (eight) hours as needed for nausea or vomiting. 10/20/16   Emeline General, PA-C    Family History Family History  Problem Relation Age of Onset  . Diverticulosis Mother   . Diabetes Mother   . Diabetes Sister   . Kidney disease Paternal Aunt     Social History Social History  Substance Use Topics  . Smoking status: Current Some Day Smoker    Packs/day: 1.00    Years: 25.00    Types: Cigarettes  . Smokeless tobacco: Never Used     Comment: 10-05-2015  . Alcohol use No     Allergies   Morphine; Naproxen sodium; Morphine and related; Prednisone; Advil [ibuprofen]; Benadryl [diphenhydramine]; Lactose intolerance (gi); Propoxyphene; Sudafed [pseudoephedrine hcl]; Tylenol [acetaminophen]; Humulin [insulin nph isophane & regular]; Lactulose; Sulfa antibiotics; and Tape   Review of Systems Review of Systems  Constitutional: Negative for chills and fever.  HENT: Negative for ear pain and sore throat.   Eyes: Positive for photophobia. Negative for pain and visual disturbance.  Respiratory: Negative for cough, chest tightness, shortness of breath, wheezing and stridor.   Cardiovascular: Negative for chest pain, palpitations and leg swelling.  Gastrointestinal: Positive for abdominal pain, blood in stool and nausea. Negative for abdominal distention and vomiting.  Genitourinary: Positive for dysuria. Negative for flank pain, frequency and hematuria.  Musculoskeletal: Negative for arthralgias, back pain, gait problem, myalgias and neck stiffness.  Skin: Negative for color change, pallor and rash.  Neurological: Positive for light-headedness and headaches. Negative for seizures and syncope.  All other systems reviewed and are negative.    Physical Exam Updated Vital Signs BP 107/70   Pulse 75   Temp 98 F (36.7 C) (Oral)   Resp 18   SpO2 98%   Physical Exam  Constitutional: She  appears well-developed and well-nourished. No distress.  Patient is afebrile, nontoxic-appearing, lying comfortably in bed in no acute distress with her head covered under her sweater.  HENT:  Head: Normocephalic and atraumatic.  Eyes: Conjunctivae and EOM are normal.  Neck: Normal range of motion. Neck supple.  Cardiovascular: Normal rate, regular rhythm and normal heart sounds.   No murmur heard. Pulmonary/Chest: Effort normal and breath sounds normal. No respiratory distress. She has no wheezes. She has no rales. She exhibits no tenderness.  Abdominal: Soft. She exhibits no distension and no mass. There is tenderness. There is no guarding.  Suprapubic tenderness on palpation  Musculoskeletal: Normal range of motion. She exhibits tenderness. She exhibits no edema or deformity.  Patient is reacting to manual palpation of the entire back and shoulders neck but did not react at all when using the stethoscope to listen and press down on her  back or abdomen with same pressure. Inconsistent exam  Neurological: She is alert.  Skin: Skin is warm and dry. She is not diaphoretic.  Psychiatric: She has a normal mood and affect.  Nursing note and vitals reviewed.    ED Treatments / Results  Labs (all labs ordered are listed, but only abnormal results are displayed) Labs Reviewed  COMPREHENSIVE METABOLIC PANEL - Abnormal; Notable for the following:       Result Value   Glucose, Bld 124 (*)    All other components within normal limits  CBC - Abnormal; Notable for the following:    Hemoglobin 15.5 (*)    All other components within normal limits  URINALYSIS, ROUTINE W REFLEX MICROSCOPIC - Abnormal; Notable for the following:    Hgb urine dipstick MODERATE (*)    Ketones, ur 20 (*)    Bacteria, UA RARE (*)    Squamous Epithelial / LPF 0-5 (*)    All other components within normal limits  LIPASE, BLOOD    EKG  EKG Interpretation None       Radiology No results  found.  Procedures Procedures (including critical care time)  Medications Ordered in ED Medications  ondansetron (ZOFRAN) injection 4 mg (4 mg Intravenous Given 10/20/16 1104)  sodium chloride 0.9 % bolus 1,000 mL (0 mLs Intravenous Stopped 10/20/16 1335)     Initial Impression / Assessment and Plan / ED Course  I have reviewed the triage vital signs and the nursing notes.  Pertinent labs & imaging results that were available during my care of the patient were reviewed by me and considered in my medical decision making (see chart for details).     51 year old female with self reported history of Crohn's (CT from 01/2016 without significant inflammatory bowel disease.),  presenting with suprapubic pain, dysuria, nausea, and headache in the back of her head and shoulders as well as entire back discomfort. Exam was inconsistent as when pressing with stethoscope to listen to her lungs and heart she was not reacting, but when palpating she was pulling away reacting as tender. Same with abdominal exam with stethoscope no reaction with pressure but reactive to manual palpation of the lower abdomen.  Patient has had a full hysterectomy with bilateral oophorectomy, appendectomy, cholecystectomy and is currently followed by gastroenterology. Labs without evidence of UTI, with signs of mild dehydration. Otherwise unremarkable Exam was otherwise unremarkable. She was hemodynamically stable while observed in the ED. Patient was given a liter bolus and Zofran and reported feeling better on reassessment, no longer nauseated.  When reassessed after fluids, patient was sleeping comfortably and husband was asking about discharge as they were ready to go home. Patient's condition significantly improved while in the ED.  Patient is already being followed by gastroenterology. 01/26/2016 : CT without evidence of significant inflammatory bowel disease.  Recommend close follow-up with GI and PCP.  Discharge home  with symptomatic relief and close GI follow-up.  Discussed strict return precautions. Patient was advised to return to the emergency department if experiencing any new or worsening symptoms. Patient clearly understood instructions and agreed with discharge plan.  Final Clinical Impressions(s) / ED Diagnoses   Final diagnoses:  Nausea  Back muscle spasm    New Prescriptions New Prescriptions   METHOCARBAMOL (ROBAXIN) 500 MG TABLET    Take 1 tablet (500 mg total) by mouth at bedtime as needed for muscle spasms.   ONDANSETRON (ZOFRAN) 4 MG TABLET    Take 1 tablet (4 mg total) by mouth  every 8 (eight) hours as needed for nausea or vomiting.     Emeline General, PA-C 10/20/16 Lexa, MD 10/30/16 [phone]

## 2016-10-23 DIAGNOSIS — M6250 Muscle wasting and atrophy, not elsewhere classified, unspecified site: Secondary | ICD-10-CM | POA: Diagnosis not present

## 2016-10-27 ENCOUNTER — Other Ambulatory Visit (INDEPENDENT_AMBULATORY_CARE_PROVIDER_SITE_OTHER): Payer: BLUE CROSS/BLUE SHIELD

## 2016-10-27 ENCOUNTER — Ambulatory Visit (INDEPENDENT_AMBULATORY_CARE_PROVIDER_SITE_OTHER): Payer: BLUE CROSS/BLUE SHIELD | Admitting: Gastroenterology

## 2016-10-27 ENCOUNTER — Encounter: Payer: Self-pay | Admitting: Gastroenterology

## 2016-10-27 VITALS — BP 114/66 | HR 70 | Ht 60.0 in | Wt 185.0 lb

## 2016-10-27 DIAGNOSIS — K50919 Crohn's disease, unspecified, with unspecified complications: Secondary | ICD-10-CM | POA: Diagnosis not present

## 2016-10-27 DIAGNOSIS — R140 Abdominal distension (gaseous): Secondary | ICD-10-CM | POA: Diagnosis not present

## 2016-10-27 DIAGNOSIS — R1030 Lower abdominal pain, unspecified: Secondary | ICD-10-CM

## 2016-10-27 DIAGNOSIS — K602 Anal fissure, unspecified: Secondary | ICD-10-CM

## 2016-10-27 DIAGNOSIS — K5010 Crohn's disease of large intestine without complications: Secondary | ICD-10-CM

## 2016-10-27 LAB — IGA: IgA: 123 mg/dL (ref 68–378)

## 2016-10-27 LAB — HIGH SENSITIVITY CRP: CRP HIGH SENSITIVITY: 5.68 mg/L — AB (ref 0.000–5.000)

## 2016-10-27 MED ORDER — DILTIAZEM GEL 2 %: 1.0000 "application " | Freq: Two times a day (BID) | CUTANEOUS | 1 refills | Status: DC

## 2016-10-27 MED ORDER — MESALAMINE ER 0.375 G PO CP24: ORAL_CAPSULE | ORAL | 1 refills | Status: AC

## 2016-10-27 NOTE — Patient Instructions (Signed)
We have sent your diltizam gel to [facility]   We have sent in [place] to your [facility] pharmacy  Go to the basement for labs today  We have given you IB gard samples  We have given you VSL # 3 samples to take twice a day , These can be purchased over the counter at your local pharmacy

## 2016-10-30 LAB — TISSUE TRANSGLUTAMINASE, IGA: Tissue Transglutaminase Ab, IgA: 1 U/mL (ref <4)

## 2016-11-02 DIAGNOSIS — E119 Type 2 diabetes mellitus without complications: Secondary | ICD-10-CM | POA: Diagnosis not present

## 2016-11-03 DIAGNOSIS — K5010 Crohn's disease of large intestine without complications: Secondary | ICD-10-CM | POA: Insufficient documentation

## 2016-11-03 DIAGNOSIS — M542 Cervicalgia: Secondary | ICD-10-CM | POA: Diagnosis not present

## 2016-11-03 DIAGNOSIS — Z6836 Body mass index (BMI) 36.0-36.9, adult: Secondary | ICD-10-CM | POA: Diagnosis not present

## 2016-11-03 DIAGNOSIS — R140 Abdominal distension (gaseous): Secondary | ICD-10-CM | POA: Insufficient documentation

## 2016-11-03 DIAGNOSIS — M545 Low back pain: Secondary | ICD-10-CM | POA: Diagnosis not present

## 2016-11-03 DIAGNOSIS — R1030 Lower abdominal pain, unspecified: Secondary | ICD-10-CM | POA: Insufficient documentation

## 2016-11-03 NOTE — Progress Notes (Addendum)
10/27/2016 Hansel Starling [id number] 28-Jan-1966  CC:  Diarrhea and abdominal bloating, nausea, blood in stools  HISTORY OF PRESENT ILLNESS:  This is a 51 year old female who established care with Dr. Silverio Decamp in 09/2015.  Has had extensive evaluation at other GI facilities in the past.  Has chronic complaints of diarrhea and bloating for about 20 years.  Comes here today with the same complaints.  Abdomen gets very bloated and distended like she is "15 months pregnant" according her her husband.  Had diffuse, intermittent, sharp abdominal pains.  Also complains of nausea and poor appetite.  Seems to focus most on her abdominal bloating today.  Last colonoscopy was with Dr. Silverio Decamp in 09/2015 at which time she was found to have mild diverticulosis, a polyp in the cecum that was removed, and large internal hemorrhoids.  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  Biopsies of the TI were normal and random biopsies of the colon showed minimally active chronic colitis.  CT enterography 01/2016 without evidence of IBD.  Colonoscopy 01/2009 by Dr. Marin Comment showed a few aphthous ulcers at the TI but we do not have biopsy results.  Colonoscopy 05/2011 by Dr. Valli Glance but we only have biopsy result from that, no actual colonoscopy report.  TI biopsies showed focal mucosal ulceration with overlying fibrinopurulent exudate but no evidence of cryptitis or granuloma formation.  Normal biopsies of the colon.  She is going to be difficult to treat.  Likely has mild Crohn's disease.  Not currently on any medications for this.  She did not tolerate Lialda due to development of a rash.  She says that she cannot take prednisone due to side effects and had even worse side effects to Entocort in the past.  Apparently took Pentasa years ago without improvement in symptoms.  Also reports a few episodes of blood in her stool last week.  Was treated for an anal fissure after her internal hemorrhoid bandings back in August  2017.  Was treated with diltiazem.  She says that overall symptoms significantly improved with that treatment until recently again.  Has some discomfort in her anus again as well but not as severe as previously.  Past Medical History:  Diagnosis Date  . Arthritis   . Crohn's colitis ([place]) 2009  . Diabetes mellitus without complication ([place])   . Hyperlipemia   . Hypertension    Past Surgical History:  Procedure Laterality Date  . ABDOMINAL HYSTERECTOMY     plus tubes and ovaries 2nd surgery  . APPENDECTOMY    . BACK SURGERY    . BREAST SURGERY Left    bx  . CARPAL TUNNEL RELEASE Bilateral   . CHOLECYSTECTOMY    . DIAGNOSTIC LAPAROSCOPY     x3 for endometriosis  . KNEE ARTHROSCOPY Bilateral   . TOTAL KNEE ARTHROPLASTY Right 12/30/2014  . TOTAL KNEE ARTHROPLASTY Right 12/30/2014   Procedure: TOTAL KNEE ARTHROPLASTY;  Surgeon: Kathryne Hitch, MD;  Location: [facility];  Service: Orthopedics;  Laterality: Right;    reports that she has been smoking Cigarettes.  She has a 25.00 pack-year smoking history. She has never used smokeless tobacco. She reports that she does not drink alcohol or use drugs. family history includes Diabetes in her mother and sister; Diverticulosis in her mother; Kidney disease in her paternal aunt. Allergies  Allergen Reactions  . Morphine Hives  . Naproxen Sodium Hives  . Morphine And Related Hives and Other (See Comments)    Tachycardia   .  Prednisone Hives and Other (See Comments)    Tachycardia (with PO steroids; has never had steroid injections)   . Advil [Ibuprofen]     sts it causes "bleeding similar to a period"  . Benadryl [Diphenhydramine] Nausea Only  . Lactose Intolerance (Gi) Diarrhea and Nausea Only  . Propoxyphene   . Sudafed [Pseudoephedrine Hcl] Nausea Only  . Tylenol [Acetaminophen] Nausea And Vomiting  . Humulin [Insulin Nph Isophane & Regular] Hives and Rash  . Lactulose Diarrhea and Nausea Only  . Sulfa Antibiotics Rash  . Tape  Dermatitis      Outpatient Encounter Prescriptions as of 10/27/2016  Medication Sig  . acetaminophen-codeine (TYLENOL #3) 300-30 MG tablet Take 1 tablet by mouth every 6 (six) hours as needed for moderate pain.  [name] Kitchen Alum & Mag Hydroxide-Simeth (ANTACID ANTI-GAS PO) Take 1 capsule by mouth as needed.  [name] Kitchen Apple Cider Vinegar 188 MG CAPS Take 1 capsule by mouth daily.  [name] Kitchen aspirin 81 MG tablet Take 81 mg by mouth every other day.   [name] (BERBERINE COMPLEX PO) Take 1 capsule by mouth daily.  . Flaxseed, Linseed, (FLAXSEED OIL) 1000 MG CAPS Take 1 capsule by mouth daily.  . Ginkgo Biloba (GINKOBA PO) Take 1 capsule by mouth daily.  [name] Kitchen guaiFENesin (MUCINEX) 600 MG 12 hr tablet Take 600 mg by mouth 2 (two) times daily as needed for cough.  . Magnesium 250 MG TABS Take 500 mg by mouth daily.   . magnesium oxide (MAG-OX) 400 MG tablet Take 400 mg by mouth daily.  . Melatonin 5 MG TABS Take 5 mg by mouth at bedtime as needed.  . methocarbamol (ROBAXIN) 500 MG tablet Take 1 tablet (500 mg total) by mouth at bedtime as needed for muscle spasms.  . Multiple Vitamin (MULTIVITAMIN) tablet Take 2 tablets by mouth daily.   . ondansetron (ZOFRAN) 4 MG tablet Take 1 tablet (4 mg total) by mouth every 8 (eight) hours as needed for nausea or vomiting.  [name] Kitchen OVER THE COUNTER MEDICATION Take 2 capsules by mouth daily. "Vita Fusion-Vitamin D3/Bone health/Immune System" supplement  . sodium chloride (OCEAN) 0.65 % SOLN nasal spray Place 1 spray into both nostrils as needed for congestion.  . Wheat Dextrin (BENEFIBER DRINK MIX) PACK Take 1 packet by mouth daily.  [name] Kitchen diltiazem 2 % GEL Apply 1 application topically 2 (two) times daily.  . mesalamine (APRISO) 0.375 g 24 hr capsule Take 4 capsules once daily  . [DISCONTINUED] diltiazem 2 % GEL Apply a pea size amount into rectum TID  . [DISCONTINUED] Nutritional Supplements (MENOPAUSE FORMULA PO) Take 1 tablet by mouth daily.   No  facility-administered encounter medications on file as of 10/27/2016.      REVIEW OF SYSTEMS  : All other systems reviewed and negative except where noted in the History of Present Illness.   PHYSICAL EXAM: BP 114/66   Pulse 70   Ht 5' (1.524 m)   Wt 185 lb (83.9 kg)   BMI 36.13 kg/m  General: Well developed white female in no acute distress Head: Normocephalic and atraumatic Eyes:  Sclerae anicteric, conjunctiva pink. Ears: Normal auditory acuity Lungs: Clear throughout to auscultation Heart: Regular rate and rhythm Abdomen: Soft, non-distended.  Normal bowel sounds.  Mild diffuse TTP. Rectal:  External hemorrhoids noted.  Posterior anal fissure seen on exam today as well. Musculoskeletal: Symmetrical with no gross deformities  Skin: No lesions on visible extremities Extremities: No edema  Neurological: Alert oriented x 4, grossly non-focal Psychological:  Alert and cooperative. Normal mood and affect  ASSESSMENT AND PLAN: -51 year old female with chronic complaints of abdominal pain, bloating, diarrhea.  Has what appears to be mild Crohn's disease.  She is going to be difficult to treat.  Disease appears to be mild.  She did not tolerate Lialda due to development of a rash.  She says that she cannot take prednisone due to side effects and had even worse side effects to Entocort in the past.  I am going to put her on a different mesalamine, Apriso 4 pills daily, to see how she tolerates this.  I am also going to have her start taking VSL #3 twice daily (samples given as well).  Will try IBgard prn for bloating also (samples also given of this).  Will also check a CRP level today. -Anal fissure:  Seen on exam today.  Will treat again with diltiazem as we had previously (still has some of this at home as well).  Avoid aggressive hygiene.  *Will follow-up with Dr. Silverio Decamp in about 4 weeks.   CC:  Libby Maw,*

## 2016-11-06 NOTE — Progress Notes (Signed)
Reviewed and agree with documentation and assessment and plan. K. Veena Leontae Bostock , MD   

## 2016-11-20 DIAGNOSIS — M6250 Muscle wasting and atrophy, not elsewhere classified, unspecified site: Secondary | ICD-10-CM | POA: Diagnosis not present

## 2016-12-04 ENCOUNTER — Ambulatory Visit: Payer: BLUE CROSS/BLUE SHIELD | Admitting: Gastroenterology

## 2016-12-18 ENCOUNTER — Ambulatory Visit: Payer: BLUE CROSS/BLUE SHIELD | Attending: Neurosurgery | Admitting: Physical Therapy

## 2016-12-18 DIAGNOSIS — F250 Schizoaffective disorder, bipolar type: Secondary | ICD-10-CM | POA: Diagnosis not present

## 2016-12-18 DIAGNOSIS — F17210 Nicotine dependence, cigarettes, uncomplicated: Secondary | ICD-10-CM | POA: Diagnosis not present

## 2016-12-18 DIAGNOSIS — E785 Hyperlipidemia, unspecified: Secondary | ICD-10-CM | POA: Diagnosis not present

## 2016-12-18 DIAGNOSIS — Z8739 Personal history of other diseases of the musculoskeletal system and connective tissue: Secondary | ICD-10-CM | POA: Diagnosis not present

## 2016-12-18 DIAGNOSIS — M1990 Unspecified osteoarthritis, unspecified site: Secondary | ICD-10-CM | POA: Diagnosis not present

## 2016-12-18 DIAGNOSIS — R45851 Suicidal ideations: Secondary | ICD-10-CM | POA: Diagnosis not present

## 2016-12-18 DIAGNOSIS — K5090 Crohn's disease, unspecified, without complications: Secondary | ICD-10-CM | POA: Diagnosis not present

## 2016-12-18 DIAGNOSIS — M542 Cervicalgia: Secondary | ICD-10-CM | POA: Diagnosis not present

## 2016-12-18 DIAGNOSIS — E119 Type 2 diabetes mellitus without complications: Secondary | ICD-10-CM | POA: Diagnosis not present

## 2016-12-18 DIAGNOSIS — Z7982 Long term (current) use of aspirin: Secondary | ICD-10-CM | POA: Diagnosis not present

## 2016-12-18 DIAGNOSIS — F1123 Opioid dependence with withdrawal: Secondary | ICD-10-CM | POA: Diagnosis not present

## 2016-12-18 DIAGNOSIS — F1220 Cannabis dependence, uncomplicated: Secondary | ICD-10-CM | POA: Diagnosis not present

## 2016-12-18 DIAGNOSIS — R51 Headache: Secondary | ICD-10-CM | POA: Diagnosis not present

## 2016-12-18 DIAGNOSIS — R4182 Altered mental status, unspecified: Secondary | ICD-10-CM | POA: Diagnosis not present

## 2016-12-19 DIAGNOSIS — F251 Schizoaffective disorder, depressive type: Secondary | ICD-10-CM | POA: Diagnosis not present

## 2016-12-19 DIAGNOSIS — F1123 Opioid dependence with withdrawal: Secondary | ICD-10-CM | POA: Diagnosis not present

## 2016-12-19 DIAGNOSIS — F1210 Cannabis abuse, uncomplicated: Secondary | ICD-10-CM | POA: Diagnosis not present

## 2016-12-19 DIAGNOSIS — R45851 Suicidal ideations: Secondary | ICD-10-CM | POA: Diagnosis not present

## 2016-12-20 DIAGNOSIS — F1220 Cannabis dependence, uncomplicated: Secondary | ICD-10-CM | POA: Diagnosis not present

## 2016-12-20 DIAGNOSIS — F250 Schizoaffective disorder, bipolar type: Secondary | ICD-10-CM | POA: Diagnosis not present

## 2016-12-20 DIAGNOSIS — F1120 Opioid dependence, uncomplicated: Secondary | ICD-10-CM | POA: Diagnosis not present

## 2016-12-20 DIAGNOSIS — Z6835 Body mass index (BMI) 35.0-35.9, adult: Secondary | ICD-10-CM | POA: Diagnosis not present

## 2016-12-21 DIAGNOSIS — M6250 Muscle wasting and atrophy, not elsewhere classified, unspecified site: Secondary | ICD-10-CM | POA: Diagnosis not present

## 2016-12-27 ENCOUNTER — Ambulatory Visit: Payer: BLUE CROSS/BLUE SHIELD | Admitting: Physical Therapy

## 2017-01-02 DIAGNOSIS — M25562 Pain in left knee: Secondary | ICD-10-CM | POA: Diagnosis not present

## 2017-01-02 DIAGNOSIS — M25551 Pain in right hip: Secondary | ICD-10-CM | POA: Diagnosis not present

## 2017-01-02 DIAGNOSIS — M25561 Pain in right knee: Secondary | ICD-10-CM | POA: Diagnosis not present

## 2017-01-03 DIAGNOSIS — M25551 Pain in right hip: Secondary | ICD-10-CM | POA: Diagnosis not present

## 2017-01-03 DIAGNOSIS — M545 Low back pain: Secondary | ICD-10-CM | POA: Diagnosis not present

## 2017-01-20 DIAGNOSIS — M6250 Muscle wasting and atrophy, not elsewhere classified, unspecified site: Secondary | ICD-10-CM | POA: Diagnosis not present

## 2017-02-01 DIAGNOSIS — Z6835 Body mass index (BMI) 35.0-35.9, adult: Secondary | ICD-10-CM | POA: Diagnosis not present

## 2017-02-01 DIAGNOSIS — E119 Type 2 diabetes mellitus without complications: Secondary | ICD-10-CM | POA: Diagnosis not present

## 2017-02-20 DIAGNOSIS — M6250 Muscle wasting and atrophy, not elsewhere classified, unspecified site: Secondary | ICD-10-CM | POA: Diagnosis not present

## 2017-03-22 DIAGNOSIS — M6250 Muscle wasting and atrophy, not elsewhere classified, unspecified site: Secondary | ICD-10-CM | POA: Diagnosis not present

## 2017-04-22 DIAGNOSIS — M6250 Muscle wasting and atrophy, not elsewhere classified, unspecified site: Secondary | ICD-10-CM | POA: Diagnosis not present

## 2017-05-23 DIAGNOSIS — M6250 Muscle wasting and atrophy, not elsewhere classified, unspecified site: Secondary | ICD-10-CM | POA: Diagnosis not present

## 2017-06-22 DIAGNOSIS — M6250 Muscle wasting and atrophy, not elsewhere classified, unspecified site: Secondary | ICD-10-CM | POA: Diagnosis not present

## 2017-07-23 DIAGNOSIS — M6250 Muscle wasting and atrophy, not elsewhere classified, unspecified site: Secondary | ICD-10-CM | POA: Diagnosis not present

## 2017-09-19 DIAGNOSIS — M25562 Pain in left knee: Secondary | ICD-10-CM | POA: Diagnosis not present

## 2017-09-19 DIAGNOSIS — M25511 Pain in right shoulder: Secondary | ICD-10-CM | POA: Diagnosis not present

## 2017-09-19 DIAGNOSIS — M542 Cervicalgia: Secondary | ICD-10-CM | POA: Diagnosis not present

## 2017-09-19 DIAGNOSIS — M25512 Pain in left shoulder: Secondary | ICD-10-CM | POA: Diagnosis not present

## 2017-09-22 DIAGNOSIS — M6250 Muscle wasting and atrophy, not elsewhere classified, unspecified site: Secondary | ICD-10-CM | POA: Diagnosis not present

## 2017-09-27 DIAGNOSIS — E119 Type 2 diabetes mellitus without complications: Secondary | ICD-10-CM | POA: Diagnosis not present

## 2017-10-04 ENCOUNTER — Ambulatory Visit: Payer: BLUE CROSS/BLUE SHIELD | Admitting: Gastroenterology

## 2017-10-04 ENCOUNTER — Encounter: Payer: Self-pay | Admitting: Gastroenterology

## 2017-10-04 ENCOUNTER — Encounter (INDEPENDENT_AMBULATORY_CARE_PROVIDER_SITE_OTHER): Payer: Self-pay

## 2017-10-04 VITALS — BP 118/68 | HR 88 | Ht 60.0 in | Wt 193.0 lb

## 2017-10-04 DIAGNOSIS — K529 Noninfective gastroenteritis and colitis, unspecified: Secondary | ICD-10-CM | POA: Diagnosis not present

## 2017-10-04 DIAGNOSIS — K602 Anal fissure, unspecified: Secondary | ICD-10-CM | POA: Diagnosis not present

## 2017-10-04 DIAGNOSIS — K625 Hemorrhage of anus and rectum: Secondary | ICD-10-CM | POA: Diagnosis not present

## 2017-10-04 DIAGNOSIS — K582 Mixed irritable bowel syndrome: Secondary | ICD-10-CM | POA: Diagnosis not present

## 2017-10-04 MED ORDER — AMBULATORY NON FORMULARY MEDICATION: 0 refills | Status: DC

## 2017-10-04 MED ORDER — AMBULATORY NON FORMULARY MEDICATION: 0 refills | Status: AC

## 2017-10-04 NOTE — Patient Instructions (Addendum)
If you are age 52 or older, your body mass index should be between 23-30. Your Body mass index is 37.69 kg/m. If this is out of the aforementioned range listed, please consider follow up with your Primary Care Provider.  If you are age 52 or younger, your body mass index should be between 19-25. Your Body mass index is 37.69 kg/m. If this is out of the aformentioned range listed, please consider follow up with your Primary Care Provider.   Restart Benefiber daily.    Patient Drug Education for Nitroglycerin Ointment  Nitroglycerin ointment (NTG) is used to help heal anal fissures. The ointment relaxes the smooth muscle around the anus and promotes blood flow which helps heal the fissure (tear). The NTG reduces anal canal pressure, which diminishes pain and spasm. We use a diluted concentration of NTG (.125%) compared to the 2% that is typically used for heart patients, and this is why you need to obtain the medication from a pharmacy which will compound your prescription.  The NTG ointment should be applied 3 times per day, or as directed.  A pea-sized drop should be placed on the tip of your finger and then gently placed inside the anus. The finger should be inserted 1/3 - 1/2 its length and may be covered with a plastic glove or finger cot. You may use Vaseline to help coat the finger or dilute the ointment. If you are advised to mix the NTG with steroid ointment, limit the steroids to one to two weeks.  The first few applications should be taken lying down, as mild light-headedness or a brief headache may occur.  It may take several weeks for the fissure to begin healing, and you will need to continue taking the medication after resolution of your symptoms.  It is important to continue the treatment for the entire time period - up to 3 months or as directed. It takes up to two years for the healing tissue to regain the normal skin strength. You will be advised to add fiber to your diet, increase  water intake to 7-8 glasses per day, take relaxing baths or sitz baths, and avoid prolonged sitting and straining on the commode. Local anesthetic ointment may be added.  Initially, the anal fissure is very inflamed, which allows more of the NTG to get into the blood. This allows for a higher incidence of the most common side effect - a headache. It is usually brief and mild, but may require Tylenol or Advil. You may dilute the NTG further with Vaseline to decrease the headaches. As the treatment progresses and the fissure begins to heal, the headaches will tend to dissipate. Other side effects include lightheadedness, flushing, dizziness, nervousness, nausea, and vomiting. If any of these side effects persist or worsen, notify us promptly. Stop using the NTG and notify us immediately if you develop the rare side effects of severe dizziness, fainting, fast/pounding heartbeat, paleness, sweating, blurred vision, dry mouth, dark urine, bluish lips/skin/nails, unusual tiredness, severe weakness, irregular heartbeat, seizures, or chest pain. Serious allergic reactions are unusual, but seek immediate medical attention if you develop a rash, swelling, dizziness, or trouble breathing.  Tell us if you are allergic to nitrates, have severe anemia, low blood pressure, dehydration, chronic heart failure, cardiomyopathy, recent heart attack, increased pressure in the brain, or exposure to nitrates while on the job. Do not use NTG while driving or working around machinery if you are drowsy, dizzy, have lightheadedness, or blurred vision. Limit alcoholic beverages. To  minimize dizziness and lightheadedness, get up slowly when rising from a sitting or lying position. The elderly may be more prone to dizziness and falling. While there are not adequate studies to confirm the safety of NTG in pregnant or breast feeding women, it has been used without incident so far. We recommend waiting at least one hour after applying the  NTG ointment before breast feeding.  Do not use NTG ointment if you are taking drugs for sexual problems [e.g., sildenafil (Viagra), tadalafil (Cialis), vardenafil (Levitra)]. Use caution before taking cough-and-cold products, diet aids, or NSAIDs preparations because they may contain ingredients that could increase your blood pressure, cause a fast heartbeat, or increase chest pain (e.g., pseudoephedrine, phenylephrine, chlorpheniramine, diphenhydramine, clemastine, ibuprofen, and naproxen). Tell us if you drink alcohol, take alteplase, migraine drugs (ergotamine), water pills/diuretics such as furosemide or hydrochlorothiazide, or other drugs for high blood pressure (beta blockers, calcium channel blockers, ACE inhibitors).  Store the NTG at room temperature and keep away from light and moisture. Close the container tightly after each use. Do not store in the bathroom. Keep away from children and pets. If you have any questions or problems please call us at  [phone].

## 2017-10-04 NOTE — Progress Notes (Signed)
Barbara Dyer    [id number]    1965/09/22  Primary Care Physician:Kremer, Mortimer Fries, MD  Referring Physician: Libby Maw, MD [place], [place] [address]  Chief complaint: Bright red blood per rectum  HPI: 52 year old female with chronic GI complaints. ?  Indeterminant IBD. Continues to have alternating episodes of constipation and diarrhea.  She can go for 2-3 days with no bowel movement followed by multiple bowel movements.  She started having bright red blood per rectum when she strains in the toilet and when she wipes.  She also has anal discomfort with bowel movement  She is currently not on any therapy for Crohn's disease/IBD.  She did not tolerate mesalamine.  She had significant side effects associated with Entocort and prednisone.  She has been reluctant to start any therapy in the past She took probiotics with no improvement.  CRP was mildly elevated 5.6 from 4.5 at last visit February 2018 compared to January 2017   Last colonoscopy in 09/2015 at which time she was found to have mild diverticulosis, a polyp in the cecum that was removed, and large internal hemorrhoids.  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  Biopsies of the TI were normal and random biopsies of the colon showed minimally active chronic colitis.  CT enterography 01/2016 without evidence of IBD.  Colonoscopy 01/2009 by Dr. Marin Comment showed a few aphthous ulcers at the TI but we do not have biopsy results.  Colonoscopy 05/2011 by Dr. Valli Glance but we only have biopsy result from that, no actual colonoscopy report.  TI biopsies showed focal mucosal ulceration with overlying fibrinopurulent exudate but no evidence of cryptitis or granuloma formation.  Normal biopsies of the colon.    Outpatient Encounter Medications as of 10/04/2017  Medication Sig  . acetaminophen-codeine (TYLENOL #3) 300-30 MG tablet Take 1 tablet by mouth every 6 (six) hours as needed for  moderate pain.  [name] Kitchen Alum & Mag Hydroxide-Simeth (ANTACID ANTI-GAS PO) Take 1 capsule by mouth as needed.  [name] Kitchen Apple Cider Vinegar 188 MG CAPS Take 1 capsule by mouth daily.  [name] Kitchen aspirin 81 MG tablet Take 81 mg by mouth every other day.   [name] (BERBERINE COMPLEX PO) Take 1 capsule by mouth daily.  . COCONUT OIL PO Take by mouth.  . diltiazem 2 % GEL Apply 1 application topically 2 (two) times daily.  . Flaxseed, Linseed, (FLAXSEED OIL) 1000 MG CAPS Take 1 capsule by mouth daily.  . Ginkgo Biloba (GINKOBA PO) Take 1 capsule by mouth daily.  [name] Kitchen guaiFENesin (MUCINEX) 600 MG 12 hr tablet Take 600 mg by mouth 2 (two) times daily as needed for cough.  . Magnesium 250 MG TABS Take 500 mg by mouth daily.   . magnesium oxide (MAG-OX) 400 MG tablet Take 400 mg by mouth daily.  . Melatonin 5 MG TABS Take 5 mg by mouth at bedtime as needed.  . mesalamine (APRISO) 0.375 g 24 hr capsule Take 4 capsules once daily  . methocarbamol (ROBAXIN) 500 MG tablet Take 1 tablet (500 mg total) by mouth at bedtime as needed for muscle spasms.  . Multiple Vitamin (MULTIVITAMIN) tablet Take 2 tablets by mouth daily.   . ondansetron (ZOFRAN) 4 MG tablet Take 1 tablet (4 mg total) by mouth every 8 (eight) hours as needed for nausea or vomiting.  [name] Kitchen OVER THE COUNTER MEDICATION Take 2 capsules by mouth daily. "Vita Fusion-Vitamin D3/Bone health/Immune  System" supplement  . sodium chloride (OCEAN) 0.65 % SOLN nasal spray Place 1 spray into both nostrils as needed for congestion.  . Wheat Dextrin (BENEFIBER DRINK MIX) PACK Take 1 packet by mouth daily.   No facility-administered encounter medications on file as of 10/04/2017.     Allergies as of 10/04/2017 - Review Complete 10/04/2017  Allergen Reaction Noted  . Morphine Hives 09/13/2014  . Naproxen sodium Hives 06/23/2015  . Morphine and related Hives and Other (See Comments) 11/28/2013  . Prednisone Hives and Other (See Comments) 11/28/2013  . Advil  [ibuprofen]  10/20/2016  . Benadryl [diphenhydramine] Nausea Only 10/20/2016  . Lactose intolerance (gi) Diarrhea and Nausea Only 06/25/2015  . Propoxyphene  09/13/2014  . Sudafed [pseudoephedrine hcl] Nausea Only 10/20/2016  . Tylenol [acetaminophen] Nausea And Vomiting 10/20/2016  . Humulin [insulin nph isophane & regular] Hives and Rash 11/28/2013  . Lactulose Diarrhea and Nausea Only 01/25/2016  . Sulfa antibiotics Rash 12/04/2014  . Tape Dermatitis 11/28/2013    Past Medical History:  Diagnosis Date  . Arthritis   . Crohn's colitis ([place]) 2009  . Diabetes mellitus without complication ([place])   . Hyperlipemia   . Hypertension     Past Surgical History:  Procedure Laterality Date  . ABDOMINAL HYSTERECTOMY     plus tubes and ovaries 2nd surgery  . APPENDECTOMY    . BACK SURGERY    . BREAST SURGERY Left    bx  . CARPAL TUNNEL RELEASE Bilateral   . CHOLECYSTECTOMY    . DIAGNOSTIC LAPAROSCOPY     x3 for endometriosis  . KNEE ARTHROSCOPY Bilateral   . TOTAL KNEE ARTHROPLASTY Right 12/30/2014  . TOTAL KNEE ARTHROPLASTY Right 12/30/2014   Procedure: TOTAL KNEE ARTHROPLASTY;  Surgeon: Kathryne Hitch, MD;  Location: [facility];  Service: Orthopedics;  Laterality: Right;    Family History  Problem Relation Age of Onset  . Diverticulosis Mother   . Diabetes Mother   . Diabetes Sister   . Kidney disease Paternal Aunt   . Lung cancer Paternal Grandfather   . Colon cancer Neg Hx   . Stomach cancer Neg Hx     Social History   Socioeconomic History  . Marital status: Single    Spouse name: Not on file  . Number of children: 2  . Years of education: Not on file  . Highest education level: Not on file  Social Needs  . Financial resource strain: Not on file  . Food insecurity - worry: Not on file  . Food insecurity - inability: Not on file  . Transportation needs - medical: Not on file  . Transportation needs - non-medical: Not on file  Occupational History  . Occupation:  housewife  Tobacco Use  . Smoking status: Current Some Day Smoker    Packs/day: 1.00    Years: 25.00    Pack years: 25.00    Types: Cigarettes  . Smokeless tobacco: Never Used  . Tobacco comment: 10-05-2015  Substance and Sexual Activity  . Alcohol use: No    Alcohol/week: 0.0 oz  . Drug use: No  . Sexual activity: Not on file  Other Topics Concern  . Not on file  Social History Narrative  . Not on file      Review of systems: Review of Systems  Constitutional: Negative for fever and chills.  HENT: Negative.   Eyes: Negative for blurred vision.  Respiratory: Negative for cough, shortness of breath and wheezing.   Cardiovascular: Negative for chest pain  and palpitations.  Gastrointestinal: as per HPI Genitourinary: Negative for dysuria, urgency, frequency and hematuria.  Musculoskeletal: Negative for myalgias, back pain and joint pain.  Skin: Negative for itching and rash.  Neurological: Negative for dizziness, tremors, focal weakness, seizures and loss of consciousness.  Endo/Heme/Allergies: Positive for seasonal allergies.  Psychiatric/Behavioral: Negative for depression, suicidal ideas and hallucinations.  All other systems reviewed and are negative.   Physical Exam: Vitals:   10/04/17 1340  BP: 118/68  Pulse: 88   Body mass index is 37.69 kg/m. Gen:      No acute distress HEENT:  EOMI, sclera anicteric Neck:     No masses; no thyromegaly Lungs:    Clear to auscultation bilaterally; normal respiratory effort CV:         Regular rate and rhythm; no murmurs Abd:      + bowel sounds; soft, non-tender; no palpable masses, no distension Ext:    No edema; adequate peripheral perfusion Skin:      Warm and dry; no rash Neuro: alert and oriented x 3 Psych: normal mood and affect Rectal exam: Increased anal sphincter tone, posterior anal fissure with tenderness and also has a small thrombosed external hemorrhoids Anoscopy: Not performed  Data Reviewed:  Reviewed  labs, radiology imaging, old records and pertinent past GI work up   Assessment and Plan/Recommendations:  52 year old female with history of Crohn's/IBD, evidence of minimally active chronic colitis based on random colon biopsies in January 2017.  CT enterography did not show any active disease in May 2017 Patient has multiple allergies and intolerance, has been reluctant to start any therapy for IBD Evidence of anal fissure on rectal exam I asked her to use 0.125% rectal nitroglycerin 3 times daily, small pea-sized amount for 2-3 months Benefiber 1 tablespoon 3 times daily with meals She is reluctant to take any prescription laxatives, is on magnesium oxide and multiple supplements over-the-counter Avoid excessive straining and digital disimpaction Return in 2 months or sooner if needed  25 minutes was spent face-to-face with the patient. Greater than 50% of the time used for counseling as well as treatment plan and follow-up. She had multiple questions which were answered to her satisfaction  K. Denzil Magnuson , MD [phone] Mon-Fri 8a-5p [phone] after 5p, weekends, holidays  CC: Libby Maw,*

## 2017-10-12 ENCOUNTER — Telehealth: Payer: Self-pay | Admitting: Gastroenterology

## 2017-10-12 NOTE — Telephone Encounter (Signed)
Patient states that the release form for her surgery has to be filled out and faxed to Dr.Murthy [facility] office.

## 2017-10-15 NOTE — Telephone Encounter (Signed)
I will triage the call. Before I call her, have you received a form? I assume it is because of the Crohn's that a release would come to the primary GI. Thanks

## 2017-10-15 NOTE — Telephone Encounter (Signed)
No contraindication because of Crohn's but if they clearance for cardiopulmonary, will need to contact PCP. Thanks

## 2017-10-15 NOTE — Telephone Encounter (Signed)
Called to the patient and informed her of this. Had to leave details on her voicemail.

## 2017-10-15 NOTE — Telephone Encounter (Signed)
We have faxed this back signed by Dr Silverio Decamp stating from a GI standpoint she is ok but needs to be sent to PCP instead of GI

## 2017-10-23 ENCOUNTER — Encounter: Payer: Self-pay | Admitting: Family Medicine

## 2017-10-23 ENCOUNTER — Ambulatory Visit (INDEPENDENT_AMBULATORY_CARE_PROVIDER_SITE_OTHER): Payer: BLUE CROSS/BLUE SHIELD

## 2017-10-23 ENCOUNTER — Ambulatory Visit (INDEPENDENT_AMBULATORY_CARE_PROVIDER_SITE_OTHER): Payer: BLUE CROSS/BLUE SHIELD | Admitting: Family Medicine

## 2017-10-23 VITALS — BP 124/78 | HR 99 | Temp 98.2°F | Ht 60.0 in | Wt 194.1 lb

## 2017-10-23 DIAGNOSIS — Z720 Tobacco use: Secondary | ICD-10-CM | POA: Diagnosis not present

## 2017-10-23 DIAGNOSIS — M179 Osteoarthritis of knee, unspecified: Secondary | ICD-10-CM | POA: Diagnosis not present

## 2017-10-23 DIAGNOSIS — Z0000 Encounter for general adult medical examination without abnormal findings: Secondary | ICD-10-CM | POA: Diagnosis not present

## 2017-10-23 DIAGNOSIS — R9431 Abnormal electrocardiogram [ECG] [EKG]: Secondary | ICD-10-CM | POA: Diagnosis not present

## 2017-10-23 DIAGNOSIS — M6250 Muscle wasting and atrophy, not elsewhere classified, unspecified site: Secondary | ICD-10-CM | POA: Diagnosis not present

## 2017-10-23 DIAGNOSIS — Z01818 Encounter for other preprocedural examination: Secondary | ICD-10-CM | POA: Diagnosis not present

## 2017-10-23 DIAGNOSIS — Z23 Encounter for immunization: Secondary | ICD-10-CM

## 2017-10-23 LAB — URINALYSIS, ROUTINE W REFLEX MICROSCOPIC
BILIRUBIN URINE: NEGATIVE
Ketones, ur: NEGATIVE
Leukocytes, UA: NEGATIVE
NITRITE: NEGATIVE
PH: 5 (ref 5.0–8.0)
Total Protein, Urine: NEGATIVE
Urine Glucose: NEGATIVE
Urobilinogen, UA: 0.2 (ref 0.0–1.0)

## 2017-10-23 NOTE — Progress Notes (Signed)
Subjective:  Patient ID: Barbara Dyer, female    DOB: [birth year]  Age: 52 y.o. MRN: [id number]  CC: Establish Care   HPI Barbara Dyer presents for surgical clearance for upcoming knee replacement.  She is currently seeing endocrinology for her diabetes.  Her last hemoglobin A1c was 6.4.  LDL cholesterol was 92 and her renal function was normal.  This was obtained from review of her recent visit visit with her endocrinologist.  She is taking no medicines for her diabetes other than herbals.  She is status post total abdominal hysterectomy.  She continues to smoke a pack a day.  She has not had a recent chest x-ray or EKG.  History Barbara Dyer has a past medical history of Arthritis, Crohn's colitis ([name]) (2009), Diabetes mellitus without complication ([place]), Hyperlipemia, and Hypertension.   She has a past surgical history that includes Carpal tunnel release (Bilateral); Back surgery; Knee arthroscopy (Bilateral); Appendectomy; Abdominal hysterectomy; Breast surgery (Left); Diagnostic laparoscopy; Total knee arthroplasty (Right, 12/30/2014); Total knee arthroplasty (Right, 12/30/2014); and Cholecystectomy.   Her family history includes Diabetes in her mother and sister; Diverticulosis in her mother; Kidney disease in her paternal aunt; Lung cancer in her paternal grandfather.She reports that she has been smoking cigarettes.  She has a 25.00 pack-year smoking history. she has never used smokeless tobacco. She reports that she does not drink alcohol or use drugs.  Outpatient Medications Prior to Visit  Medication Sig Dispense Refill  . Alum & Mag Hydroxide-Simeth (ANTACID ANTI-GAS PO) Take 1 capsule by mouth as needed.    . AMBULATORY NON FORMULARY MEDICATION Nitroglycerine ointment 0.125 %  Apply a pea sized amount internally four times daily. Dispense 30 GM zero refill 30 g 0  . Apple Cider Vinegar 188 MG CAPS Take 1 capsule by mouth daily.    [name] Kitchen aspirin 81 MG tablet Take 81 mg by mouth every other  day.     Barbara Dyer Grape-Goldenseal (BERBERINE COMPLEX PO) Take 1 capsule by mouth daily.    . COCONUT OIL PO Take by mouth.    . diltiazem 2 % GEL Apply 1 application topically 2 (two) times daily. 30 g 1  . Flaxseed, Linseed, (FLAXSEED OIL) 1000 MG CAPS Take 1 capsule by mouth daily.    . Ginkgo Biloba (GINKOBA PO) Take 1 capsule by mouth daily.    [name] Kitchen guaiFENesin (MUCINEX) 600 MG 12 hr tablet Take 600 mg by mouth 2 (two) times daily as needed for cough.    . Magnesium 250 MG TABS Take 500 mg by mouth daily.     . magnesium oxide (MAG-OX) 400 MG tablet Take 400 mg by mouth daily.    . Melatonin 5 MG TABS Take 5 mg by mouth at bedtime as needed.    . mesalamine (APRISO) 0.375 g 24 hr capsule Take 4 capsules once daily 120 capsule 1  . methocarbamol (ROBAXIN) 500 MG tablet Take 1 tablet (500 mg total) by mouth at bedtime as needed for muscle spasms. 12 tablet 0  . Multiple Vitamin (MULTIVITAMIN) tablet Take 2 tablets by mouth daily.     . ondansetron (ZOFRAN) 4 MG tablet Take 1 tablet (4 mg total) by mouth every 8 (eight) hours as needed for nausea or vomiting. 12 tablet 0  . OVER THE COUNTER MEDICATION Take 2 capsules by mouth daily. "Vita Fusion-Vitamin D3/Bone health/Immune System" supplement    . sodium chloride (OCEAN) 0.65 % SOLN nasal spray Place 1 spray into both nostrils as needed for congestion.    [name] Kitchen  Wheat Dextrin (BENEFIBER DRINK MIX) PACK Take 1 packet by mouth daily.    [name] Kitchen acetaminophen-codeine (TYLENOL #3) 300-30 MG tablet Take 1 tablet by mouth every 6 (six) hours as needed for moderate pain.     No facility-administered medications prior to visit.     ROS Review of Systems  Constitutional: Negative.   HENT: Negative.   Eyes: Negative.   Respiratory: Negative.   Cardiovascular: Negative.   Gastrointestinal: Negative.   Endocrine: Negative for polyphagia and polyuria.  Genitourinary: Negative.   Musculoskeletal: Positive for arthralgias and gait problem.  Skin:  Negative.   Allergic/Immunologic: Negative for immunocompromised state.  Hematological: Does not bruise/bleed easily.  Psychiatric/Behavioral: Negative.     Objective:  BP 124/78 (BP Location: Right Arm, Patient Position: Sitting, Cuff Size: Normal)   Pulse 99   Temp 98.2 F (36.8 C) (Oral)   Ht 5' (1.524 m)   Wt 194 lb 2 oz (88.1 kg)   SpO2 97%   BMI 37.91 kg/m   Physical Exam  Constitutional: She is oriented to person, place, and time. She appears well-developed and well-nourished. No distress.  HENT:  Head: Normocephalic and atraumatic.  Right Ear: External ear normal.  Left Ear: External ear normal.  Mouth/Throat: Oropharynx is clear and moist. No oropharyngeal exudate.  Eyes: Conjunctivae are normal. Pupils are equal, round, and reactive to light. Right eye exhibits no discharge. Left eye exhibits no discharge. No scleral icterus.  Neck: Neck supple. No JVD present. No tracheal deviation present. No thyromegaly present.  Cardiovascular: Normal rate, regular rhythm and normal heart sounds.  Pulmonary/Chest: Effort normal and breath sounds normal. No stridor. No respiratory distress. She has no wheezes. She has no rales.  Abdominal: Bowel sounds are normal.  Lymphadenopathy:    She has no cervical adenopathy.  Neurological: She is alert and oriented to person, place, and time.  Skin: Skin is warm and dry. No rash noted. She is not diaphoretic.  Psychiatric: She has a normal mood and affect. Her behavior is normal.      Assessment & Plan:   Barbara Dyer was seen today for establish care.  Diagnoses and all orders for this visit:  Pre-operative clearance -     Urinalysis, Routine w reflex microscopic -     DG Chest 2 View; Future -     EKG 12-Lead -     DG Chest 2 View -     Ambulatory referral to Cardiology  Healthcare maintenance -     HIV antibody  Tobacco abuse -     DG Chest 2 View; Future -     DG Chest 2 View  Need for 23-polyvalent pneumococcal  polysaccharide vaccine -     Pneumococcal polysaccharide vaccine 23-valent greater than or equal to 2yo subcutaneous/IM  Abnormal EKG -     Ambulatory referral to Cardiology   I have discontinued Barbara Dyer's acetaminophen-codeine. I am also having her maintain her multivitamin, aspirin, sodium chloride, Magnesium, Melatonin, [place] Grape-Goldenseal (BERBERINE COMPLEX PO), OVER THE COUNTER MEDICATION, guaiFENesin, BENEFIBER DRINK MIX, ondansetron, methocarbamol, Flaxseed Oil, magnesium oxide, Ginkgo Biloba (GINKOBA PO), Apple Cider Vinegar, Alum & Mag Hydroxide-Simeth (ANTACID ANTI-GAS PO), diltiazem, mesalamine, COCONUT OIL PO, and AMBULATORY NON FORMULARY MEDICATION.  No orders of the defined types were placed in this encounter.  Patient's EKG again shows poor R wave progression in the precordial leads.  She had a stress dobutamine test 3 years ago but the results are not available.  She continues to smoke a pack  per day.  Her chest x-ray showed flattening of the diaphragm with borderline cardiomegaly.  She is in surgical menopause.  She has all risk factors for cardiovascular disease and going to request a cardiology consult.  We discussed strategies for quitting smoking to include the patch lozenges and gum.  Follow-up: No Follow-up on file.  Libby Maw, MD

## 2017-10-24 LAB — HIV ANTIBODY (ROUTINE TESTING W REFLEX): HIV 1&2 Ab, 4th Generation: NONREACTIVE

## 2017-10-29 ENCOUNTER — Encounter: Payer: Self-pay | Admitting: Family Medicine

## 2017-11-20 DIAGNOSIS — M6250 Muscle wasting and atrophy, not elsewhere classified, unspecified site: Secondary | ICD-10-CM | POA: Diagnosis not present

## 2017-11-21 ENCOUNTER — Telehealth: Payer: Self-pay

## 2017-11-21 NOTE — Telephone Encounter (Signed)
Cancel appt tomorrow

## 2017-11-22 ENCOUNTER — Encounter: Payer: Self-pay | Admitting: Cardiology

## 2017-11-22 ENCOUNTER — Ambulatory Visit (INDEPENDENT_AMBULATORY_CARE_PROVIDER_SITE_OTHER): Payer: BLUE CROSS/BLUE SHIELD | Admitting: Cardiology

## 2017-11-22 ENCOUNTER — Encounter (INDEPENDENT_AMBULATORY_CARE_PROVIDER_SITE_OTHER): Payer: Self-pay

## 2017-11-22 VITALS — BP 166/88 | HR 83 | Ht 60.0 in | Wt 196.2 lb

## 2017-11-22 DIAGNOSIS — R9431 Abnormal electrocardiogram [ECG] [EKG]: Secondary | ICD-10-CM

## 2017-11-22 DIAGNOSIS — E785 Hyperlipidemia, unspecified: Secondary | ICD-10-CM

## 2017-11-22 DIAGNOSIS — I10 Essential (primary) hypertension: Secondary | ICD-10-CM

## 2017-11-22 DIAGNOSIS — R252 Cramp and spasm: Secondary | ICD-10-CM | POA: Diagnosis not present

## 2017-11-22 DIAGNOSIS — E1169 Type 2 diabetes mellitus with other specified complication: Secondary | ICD-10-CM

## 2017-11-22 DIAGNOSIS — Z720 Tobacco use: Secondary | ICD-10-CM | POA: Diagnosis not present

## 2017-11-22 DIAGNOSIS — Z01818 Encounter for other preprocedural examination: Secondary | ICD-10-CM | POA: Diagnosis not present

## 2017-11-22 NOTE — Progress Notes (Signed)
PCP: Libby Maw, MD  Dr. Percell Miller - orthopedic surgeon (the son)  Clinic Note: Chief Complaint  Patient presents with  . New Admit To SNF    Abnormal EKG  . Pre-op Exam    HPI: Barbara Dyer is a 52 y.o. female smoker (1 ppd) possibly early diagnosis of diabetes (A1c 6.4) who is being seen today for the evaluation of an Abnormal EKG & Preoperative Evaluation (for pending Knee Replacement Sgx) at the request of Libby Maw,*.  Tashiya Souders was seen on October 23, 2017 by Dr. Ethelene Hal with no major complaints.There was concern about something abnormal on her EKG and she is therefore referred for cardiology evaluation.  She was noted to have an LDL of 92 and a HgbA1c of 6.4.  Recent Hospitalizations: None  Studies Personally Reviewed - (if available, images/films reviewed: From Epic Chart or Care Everywhere)  EKG from PCP: Rate: 90 ;  Rhythm: normal sinus rhythm and Normal axis, intervals and durations.  The computer reads it as cannot exclude anterior infarct based on poor R wave progression which is a very low specificity finding;   Narrative Interpretation: Essentially normal EKG  Interval History: [name] presents here today really without any major cardiac complaints.  She denies any chest pain or shortness of breath with rest or exertion.  She denies any PND, orthopnea or edema.  She denies any rapid irregular heartbeats or palpitations.  No lightheadedness, dizziness, weakness or syncope/near syncope. No melena, hematochezia, hematuria, or epstaxis. No claudication she says that her legs may occasionally feel aching or cramping with activity, but she right now is limited because of her knee pain and thinks that that may be part of it is related to.  She has relatively low stage/early diabetes, and normal renal function.  No history of stroke although she does occasionally has some blurry vision and headache issues.  But no TIA or amaurosis fugax symptoms.  ROS: A  comprehensive was performed. Review of Systems  Constitutional: Negative for chills, fever and malaise/fatigue.  HENT: Negative for congestion and nosebleeds.   Eyes: Positive for blurred vision.  Respiratory: Negative for cough, shortness of breath and wheezing.   Cardiovascular:       Per HPI  Gastrointestinal: Negative for abdominal pain and heartburn.  Genitourinary: Negative for dysuria and frequency.  Musculoskeletal: Positive for joint pain (Left knee pain.).  Neurological: Positive for headaches. Negative for dizziness.  Psychiatric/Behavioral: Negative for depression and memory loss. The patient is not nervous/anxious and does not have insomnia.   All other systems reviewed and are negative.  I have reviewed and (if needed) personally updated the patient's problem list, medications, allergies, past medical and surgical history, social and family history.   Past Medical History:  Diagnosis Date  . Arthritis   . Crohn's colitis ([place]) 2009  . Diabetes mellitus without complication (HCC)    J0K 6.4  . Hyperlipemia    Most recent LDL 94  . Hypertension    Not previously diagnosed    Past Surgical History:  Procedure Laterality Date  . ABDOMINAL HYSTERECTOMY     plus tubes and ovaries 2nd surgery  . APPENDECTOMY    . BACK SURGERY    . BREAST SURGERY Left    bx  . CARPAL TUNNEL RELEASE Bilateral   . CHOLECYSTECTOMY    . DIAGNOSTIC LAPAROSCOPY     x3 for endometriosis  . KNEE ARTHROSCOPY Bilateral   . TOTAL KNEE ARTHROPLASTY Right 12/30/2014   Procedure: TOTAL  KNEE ARTHROPLASTY;  Surgeon: Kathryne Hitch, MD;  Location: [place];  Service: Orthopedics;  Laterality: Right;    Current Meds  Medication Sig  . Alum & Mag Hydroxide-Simeth (ANTACID ANTI-GAS PO) Take 1 capsule by mouth as needed.  . AMBULATORY NON FORMULARY MEDICATION Nitroglycerine ointment 0.125 %  Apply a pea sized amount internally four times daily. Dispense 30 GM zero refill  . Apple Cider Vinegar 188 MG  CAPS Take 1 capsule by mouth daily.  [name] Kitchen aspirin 81 MG tablet Take 81 mg by mouth every other day.   [name] (BERBERINE COMPLEX PO) Take 1 capsule by mouth daily.  . COCONUT OIL PO Take by mouth.  . diltiazem 2 % GEL Apply 1 application topically 2 (two) times daily.  . Flaxseed, Linseed, (FLAXSEED OIL) 1000 MG CAPS Take 1 capsule by mouth daily.  . Ginkgo Biloba (GINKOBA PO) Take 1 capsule by mouth daily.  [name] Kitchen guaiFENesin (MUCINEX) 600 MG 12 hr tablet Take 600 mg by mouth 2 (two) times daily as needed for cough.  . Magnesium 250 MG TABS Take 500 mg by mouth daily.   . magnesium oxide (MAG-OX) 400 MG tablet Take 400 mg by mouth daily.  . Melatonin 5 MG TABS Take 5 mg by mouth at bedtime as needed.  . mesalamine (APRISO) 0.375 g 24 hr capsule Take 4 capsules once daily  . methocarbamol (ROBAXIN) 500 MG tablet Take 1 tablet (500 mg total) by mouth at bedtime as needed for muscle spasms.  . Multiple Vitamin (MULTIVITAMIN) tablet Take 2 tablets by mouth daily.   . ondansetron (ZOFRAN) 4 MG tablet Take 1 tablet (4 mg total) by mouth every 8 (eight) hours as needed for nausea or vomiting.  [name] Kitchen OVER THE COUNTER MEDICATION Take 2 capsules by mouth daily. "Vita Fusion-Vitamin D3/Bone health/Immune System" supplement  . sodium chloride (OCEAN) 0.65 % SOLN nasal spray Place 1 spray into both nostrils as needed for congestion.  . Wheat Dextrin (BENEFIBER DRINK MIX) PACK Take 1 packet by mouth daily.    Allergies  Allergen Reactions  . Morphine Hives  . Naproxen Sodium Hives  . Morphine And Related Hives and Other (See Comments)    Tachycardia   . Prednisone Hives and Other (See Comments)    Tachycardia (with PO steroids; has never had steroid injections)   . Advil [Ibuprofen]     sts it causes "bleeding similar to a period"  . Benadryl [Diphenhydramine] Nausea Only  . Lactose Intolerance (Gi) Diarrhea and Nausea Only  . Propoxyphene   . Sudafed [Pseudoephedrine Hcl] Nausea  Only  . Tylenol [Acetaminophen] Nausea And Vomiting  . Humulin [Insulin Nph Isophane & Regular] Hives and Rash  . Lactulose Diarrhea and Nausea Only  . Sulfa Antibiotics Rash  . Tape Dermatitis    Social History   Tobacco Use  . Smoking status: Current Every Day Smoker    Packs/day: 1.00    Years: 25.00    Pack years: 25.00    Types: Cigarettes  . Smokeless tobacco: Never Used  . Tobacco comment: 10-05-2015  Substance Use Topics  . Alcohol use: No    Alcohol/week: 0.0 oz  . Drug use: No   Social History   Social History Narrative   She is a married mother of 2, grandmother 65.  Smokes 1 pack a day.  Walks about 10 minutes at a time 2 days a week.    family history includes Diabetes in her mother and sister; Diverticulosis in her mother;  Heart failure in her father and mother; Kidney disease in her paternal aunt; Lung cancer in her paternal grandfather; Stroke in her father.  Wt Readings from Last 3 Encounters:  11/22/17 196 lb 3.2 oz (89 kg)  10/23/17 194 lb 2 oz (88.1 kg)  10/04/17 193 lb (87.5 kg)    PHYSICAL EXAM BP (!) 166/88 (BP Location: Right Arm)   Pulse 83   Ht 5' (1.524 m)   Wt 196 lb 3.2 oz (89 kg)   BMI 38.32 kg/m  Physical Exam  Constitutional: She is oriented to person, place, and time. She appears well-developed and well-nourished. No distress.  Healthy-appearing.  Well-groomed.  Moderate morbidly obese  Neck: No hepatojugular reflux and no JVD present. Carotid bruit is not present.  Cardiovascular: Normal rate, regular rhythm, normal heart sounds and intact distal pulses.  No extrasystoles are present. PMI is not displaced (Unable to palpate). Exam reveals no gallop and no friction rub.  No murmur heard. Pulmonary/Chest: Effort normal and breath sounds normal. No respiratory distress. She has no wheezes. She has no rales. She exhibits no tenderness.  Abdominal: Soft. Bowel sounds are normal. She exhibits no distension. There is no tenderness. There is  no rebound.  Musculoskeletal: Normal range of motion. She exhibits no edema.  Neurological: She is alert and oriented to person, place, and time. No cranial nerve deficit.  Skin: Skin is warm and dry. No rash noted. No erythema.  Psychiatric: She has a normal mood and affect. Her behavior is normal. Judgment and thought content normal.  Nursing note and vitals reviewed.    Adult ECG Report  Rate: 83 ;  Rhythm: normal sinus rhythm and Normal axis, intervals and durations.  Poor R wave progression still persistent cannot exclude anterior infarct, age undetermined.;   Narrative Interpretation: Stable EKG   Other studies Reviewed: Additional studies/ records that were reviewed today include:  Recent Labs:    No results found for: CHOL, HDL, LDLCALC, LDLDIRECT, TRIG, CHOLHDL Lab Results  Component Value Date   CREATININE 0.54 10/20/2016   BUN 11 10/20/2016   NA 137 10/20/2016   K 4.0 10/20/2016   CL 101 10/20/2016   CO2 23 10/20/2016  No results found for: HGBA1C - 6.4; LDL 94   ASSESSMENT / PLAN: Problem List Items Addressed This Visit    Tobacco abuse (Chronic)    Smoking cessation counseling briefly discussed.  More to follow pending results.      Pre-operative clearance    I am being asked to evaluate based on abnormal EKG finding which we are evaluating with an echocardiogram.  Provided this shows no wall motion normality, this should be no reason for her to not be L to proceed with her knee surgery.  She is totally asymptomatic from a cardiac standpoint by the heart failure or angina.  She is never had a stroke, and has not had diabetes long enough to  required insulin.  Normal renal function and knee surgery is low risk.  Based on ACC/AHA guidelines, in the absence of any active symptoms, would recommend proceeding to the OR without any further evaluation.  Since we are checking echocardiogram, would simply want to ensure that there is no regional wall motion  normalities.  She would be a low risk patient for low risk surgery provided the echo is normal.      Relevant Orders   ECHOCARDIOGRAM COMPLETE (Completed)   Leg cramping    Let see how she feels after her surgery.  If still abnormal, can consider lower extremity arterial evaluation.      Hyperlipidemia due to type 2 diabetes mellitus (HCC) (Chronic)    Monitor closely with new diagnosis of diabetes.  She is not currently on any thing for this.  We will continue to monitor, LDL less than 100 should be goal.      Essential hypertension (Chronic)    Her blood pressure is quite high today, but that may very well be because she is in a cardiology office for the first time.  We will reassess in follow-up. Also not on any medications.      Abnormal EKG - Primary (Chronic)    Progression is a very low specificity finding on EKG, especially in someone who is obese.  Probably a benign finding, however in order to clarify with her diabetes and hypertension, we will check a 2D echocardiogram.  If there is no anterior wall motion normality, then that would argue against her having been a prior infarct that would show up on EKG.  Plan: 2D echo      Relevant Orders   EKG 12-Lead (Completed)   ECHOCARDIOGRAM COMPLETE (Completed)       Current medicines are reviewed at length with the patient today. (+/- concerns) n/a The following changes have been made: n/a  Patient Instructions  MEDICATION INSTRUCTION  NO CHANGES     TEST SCHEDULE AT [facility] Your physician has requested that you have an echocardiogram. Echocardiography is a painless test that uses sound waves to create images of your heart. It provides your doctor with information about the size and shape of your heart and how well your heart's chambers and valves are working. This procedure takes approximately one hour. There are no restrictions for this procedure.  IF ECHO IS NORMAL , YOU WILL HAVE  CLEARANCE FOR SURGERY WITH DR MURPHY,JR- IF ABNORMAL WILL MAKE APPOINTMENT TO COME IN SOONER.    Your physician wants you to follow-up in 4 -[place].You will receive a reminder letter in the mail two months in advance. If you don't receive a letter, please call our office to schedule the follow-up appointment.     Studies Ordered:   Orders Placed This Encounter  Procedures  . EKG 12-Lead  . ECHOCARDIOGRAM COMPLETE      Glenetta Hew, M.D., M.S. Interventional Cardiologist   Pager # [phone] Phone # [phone] [address] [place], [place] [address]   Thank you for choosing [facility] at [facility]!!

## 2017-11-22 NOTE — Patient Instructions (Signed)
MEDICATION INSTRUCTION  NO CHANGES     TEST SCHEDULE AT [place] [address] Your physician has requested that you have an echocardiogram. Echocardiography is a painless test that uses sound waves to create images of your heart. It provides your doctor with information about the size and shape of your heart and how well your heart's chambers and valves are working. This procedure takes approximately one hour. There are no restrictions for this procedure.  IF ECHO IS NORMAL , YOU WILL HAVE CLEARANCE FOR SURGERY WITH DR MURPHY,JR- IF ABNORMAL WILL MAKE APPOINTMENT TO COME IN SOONER.    Your physician wants you to follow-up in 4 -[place].You will receive a reminder letter in the mail two months in advance. If you don't receive a letter, please call our office to schedule the follow-up appointment.

## 2017-11-23 ENCOUNTER — Ambulatory Visit (HOSPITAL_COMMUNITY): Payer: BLUE CROSS/BLUE SHIELD | Attending: Cardiovascular Disease

## 2017-11-23 ENCOUNTER — Other Ambulatory Visit: Payer: Self-pay

## 2017-11-23 DIAGNOSIS — E119 Type 2 diabetes mellitus without complications: Secondary | ICD-10-CM | POA: Insufficient documentation

## 2017-11-23 DIAGNOSIS — R9431 Abnormal electrocardiogram [ECG] [EKG]: Secondary | ICD-10-CM | POA: Insufficient documentation

## 2017-11-23 DIAGNOSIS — E785 Hyperlipidemia, unspecified: Secondary | ICD-10-CM | POA: Diagnosis not present

## 2017-11-23 DIAGNOSIS — Z6838 Body mass index (BMI) 38.0-38.9, adult: Secondary | ICD-10-CM | POA: Insufficient documentation

## 2017-11-23 DIAGNOSIS — E669 Obesity, unspecified: Secondary | ICD-10-CM | POA: Diagnosis not present

## 2017-11-23 DIAGNOSIS — Z720 Tobacco use: Secondary | ICD-10-CM | POA: Diagnosis not present

## 2017-11-23 DIAGNOSIS — Z01818 Encounter for other preprocedural examination: Secondary | ICD-10-CM | POA: Insufficient documentation

## 2017-11-28 ENCOUNTER — Encounter: Payer: Self-pay | Admitting: Cardiology

## 2017-11-28 NOTE — Assessment & Plan Note (Signed)
Smoking cessation counseling briefly discussed.  More to follow pending results.

## 2017-11-28 NOTE — Assessment & Plan Note (Signed)
Progression is a very low specificity finding on EKG, especially in someone who is obese.  Probably a benign finding, however in order to clarify with her diabetes and hypertension, we will check a 2D echocardiogram.  If there is no anterior wall motion normality, then that would argue against her having been a prior infarct that would show up on EKG.  Plan: 2D echo

## 2017-11-28 NOTE — Assessment & Plan Note (Signed)
Let see how she feels after her surgery.  If still abnormal, can consider lower extremity arterial evaluation.

## 2017-11-28 NOTE — Assessment & Plan Note (Signed)
Monitor closely with new diagnosis of diabetes.  She is not currently on any thing for this.  We will continue to monitor, LDL less than 100 should be goal.

## 2017-11-28 NOTE — Assessment & Plan Note (Signed)
I am being asked to evaluate based on abnormal EKG finding which we are evaluating with an echocardiogram.  Provided this shows no wall motion normality, this should be no reason for her to not be L to proceed with her knee surgery.  She is totally asymptomatic from a cardiac standpoint by the heart failure or angina.  She is never had a stroke, and has not had diabetes long enough to  required insulin.  Normal renal function and knee surgery is low risk.  Based on ACC/AHA guidelines, in the absence of any active symptoms, would recommend proceeding to the OR without any further evaluation.  Since we are checking echocardiogram, would simply want to ensure that there is no regional wall motion normalities.  She would be a low risk patient for low risk surgery provided the echo is normal.

## 2017-11-28 NOTE — Assessment & Plan Note (Signed)
Her blood pressure is quite high today, but that may very well be because she is in a cardiology office for the first time.  We will reassess in follow-up. Also not on any medications.

## 2017-11-29 ENCOUNTER — Telehealth: Payer: Self-pay | Admitting: *Deleted

## 2017-11-29 NOTE — Telephone Encounter (Signed)
-----   Message from Leonie Man, MD sent at 11/28/2017  5:30 PM EDT ----- Based on the results of this echocardiogram, I see no reason for her not to proceed with knee surgery.  Would consider poor R wave progression as a non-specific finding, and not associated with prior infarct.  Would continue treating hypertension and diabetes according to guidelines.  She can see me back postoperatively so we can discuss her leg cramping as possible claudication symptoms.  Glenetta Hew, MD

## 2017-11-29 NOTE — Telephone Encounter (Signed)
New Message:    Pt is returning call about her knee surgery

## 2017-11-29 NOTE — Telephone Encounter (Signed)
LEFT message to call back - in regards to echo results  Need to know who will be doing knee surgery

## 2017-11-29 NOTE — Telephone Encounter (Signed)
Spoke to patient. Result given . Verbalized understanding Info routed to orhto

## 2017-12-03 ENCOUNTER — Telehealth: Payer: Self-pay | Admitting: Family Medicine

## 2017-12-03 NOTE — Telephone Encounter (Signed)
Copied from [place] [id number]. Topic: Quick Communication - Rx Refill/Question >> Dec 03, 2017 12:09 PM Arletha Grippe wrote: Medication: something for inflamation for back  Has the patient contacted their pharmacy? No. (Agent: If no, request that the patient contact the pharmacy for the refill.) Preferred Pharmacy (with phone number or street name): [id number] - [place], [place] [address] [place], [address] Agent: Please be advised that RX refills may take up to 3 business days. We ask that you follow-up with your pharmacy.  Pt called back dr, and since she hasn't been seen in a long time there, she is asking pcp.

## 2017-12-04 NOTE — Telephone Encounter (Signed)
Patient called, left detailed VM to return the call to the office to discuss her request for something for the inflammation of her back. I advised that she will need to be seen before anything can be prescribed, so she can also call to schedule an office visit.

## 2017-12-04 NOTE — Telephone Encounter (Signed)
Called patient to talk to her about the need for a med for inflammation in her back. No answer. Left message to call the office back and ask to speak to a triage nurse.

## 2017-12-12 ENCOUNTER — Telehealth: Payer: Self-pay | Admitting: Family Medicine

## 2017-12-12 ENCOUNTER — Ambulatory Visit: Payer: BLUE CROSS/BLUE SHIELD | Admitting: Family Medicine

## 2017-12-12 ENCOUNTER — Encounter: Payer: Self-pay | Admitting: Family Medicine

## 2017-12-12 VITALS — BP 140/80 | HR 102 | Ht 60.0 in | Wt 194.0 lb

## 2017-12-12 DIAGNOSIS — M961 Postlaminectomy syndrome, not elsewhere classified: Secondary | ICD-10-CM

## 2017-12-12 DIAGNOSIS — Z720 Tobacco use: Secondary | ICD-10-CM

## 2017-12-12 DIAGNOSIS — M545 Low back pain, unspecified: Secondary | ICD-10-CM

## 2017-12-12 DIAGNOSIS — L509 Urticaria, unspecified: Secondary | ICD-10-CM

## 2017-12-12 DIAGNOSIS — R3129 Other microscopic hematuria: Secondary | ICD-10-CM | POA: Insufficient documentation

## 2017-12-12 LAB — POCT URINALYSIS DIPSTICK
Bilirubin, UA: NEGATIVE
Blood, UA: POSITIVE
GLUCOSE UA: NEGATIVE
Ketones, UA: POSITIVE
LEUKOCYTES UA: NEGATIVE
NITRITE UA: NEGATIVE
PROTEIN UA: POSITIVE
Urobilinogen, UA: 0.2 E.U./dL
pH, UA: 5 (ref 5.0–8.0)

## 2017-12-12 MED ORDER — HYDROXYZINE HCL 10 MG PO TABS: 10.0000 mg | ORAL_TABLET | Freq: Three times a day (TID) | ORAL | 0 refills | Status: DC | PRN

## 2017-12-12 MED ORDER — NITROFURANTOIN MONOHYD MACRO 100 MG PO CAPS: 100.0000 mg | ORAL_CAPSULE | Freq: Two times a day (BID) | ORAL | 0 refills | Status: AC

## 2017-12-12 MED ORDER — OXYCODONE-ACETAMINOPHEN 2.5-300 MG PO TABS: 1.0000 | ORAL_TABLET | Freq: Three times a day (TID) | ORAL | 0 refills | Status: DC | PRN

## 2017-12-12 MED ORDER — OXYCODONE-ACETAMINOPHEN 5-325 MG PO TABS: 1.0000 | ORAL_TABLET | Freq: Three times a day (TID) | ORAL | 0 refills | Status: AC | PRN

## 2017-12-12 NOTE — Telephone Encounter (Signed)
Routing back to office 

## 2017-12-12 NOTE — Progress Notes (Addendum)
Subjective:  Patient ID: Barbara Dyer, female    DOB: July 15, 1966  Age: 52 y.o. MRN: [id number]  CC: Back Pain   HPI [name] presents for evaluation of lower back pain that radiates around each side into her suprapubic area.  There has been some frequency of urination and mild dysuria.  She denies vaginal discharge.  She has ongoing lower back issues and has been exercises at home for this.  She has had some difficulty appointment with her neurosurgeon.  She has a past medical history lumbar back surgery.  She has taken Percocet in the past without incident.   She also describes a pruritic rash that is migratory.  She denies taking any new medicines either prescription or from over-the-counter.  She is dealing with significant seasonal allergies.  She is taking Benadryl for this.  Outpatient Medications Prior to Visit  Medication Sig Dispense Refill  . Alum & Mag Hydroxide-Simeth (ANTACID ANTI-GAS PO) Take 1 capsule by mouth as needed.    . AMBULATORY NON FORMULARY MEDICATION Nitroglycerine ointment 0.125 %  Apply a pea sized amount internally four times daily. Dispense 30 GM zero refill 30 g 0  . Apple Cider Vinegar 188 MG CAPS Take 1 capsule by mouth daily.    [name] Kitchen aspirin 81 MG tablet Take 81 mg by mouth every other day.     [name] (BERBERINE COMPLEX PO) Take 1 capsule by mouth daily.    . COCONUT OIL PO Take by mouth.    . diltiazem 2 % GEL Apply 1 application topically 2 (two) times daily. 30 g 1  . Flaxseed, Linseed, (FLAXSEED OIL) 1000 MG CAPS Take 1 capsule by mouth daily.    . Ginkgo Biloba (GINKOBA PO) Take 1 capsule by mouth daily.    [name] Kitchen guaiFENesin (MUCINEX) 600 MG 12 hr tablet Take 600 mg by mouth 2 (two) times daily as needed for cough.    . Magnesium 250 MG TABS Take 500 mg by mouth daily.     . magnesium oxide (MAG-OX) 400 MG tablet Take 400 mg by mouth daily.    . Melatonin 5 MG TABS Take 5 mg by mouth at bedtime as needed.    . mesalamine  (APRISO) 0.375 g 24 hr capsule Take 4 capsules once daily 120 capsule 1  . methocarbamol (ROBAXIN) 500 MG tablet Take 1 tablet (500 mg total) by mouth at bedtime as needed for muscle spasms. 12 tablet 0  . Multiple Vitamin (MULTIVITAMIN) tablet Take 2 tablets by mouth daily.     . ondansetron (ZOFRAN) 4 MG tablet Take 1 tablet (4 mg total) by mouth every 8 (eight) hours as needed for nausea or vomiting. 12 tablet 0  . OVER THE COUNTER MEDICATION Take 2 capsules by mouth daily. "Vita Fusion-Vitamin D3/Bone health/Immune System" supplement    . sodium chloride (OCEAN) 0.65 % SOLN nasal spray Place 1 spray into both nostrils as needed for congestion.    . Wheat Dextrin (BENEFIBER DRINK MIX) PACK Take 1 packet by mouth daily.     No facility-administered medications prior to visit.     ROS Review of Systems  Constitutional: Negative for chills, fatigue, fever and unexpected weight change.  HENT: Positive for postnasal drip, rhinorrhea and sneezing.   Eyes: Negative for photophobia and visual disturbance.  Respiratory: Negative.   Cardiovascular: Negative.   Gastrointestinal: Negative.   Endocrine: Negative for polyphagia and polyuria.  Genitourinary: Positive for dysuria and frequency. Negative for urgency.  Musculoskeletal: Positive for  back pain.  Skin: Positive for rash.  Allergic/Immunologic: Negative for immunocompromised state.  Neurological: Negative for headaches.  Hematological: Negative.   Psychiatric/Behavioral: Negative.     Objective:  BP 140/80 (BP Location: Left Arm, Patient Position: Sitting, Cuff Size: Normal)   Pulse (!) 102   Ht 5' (1.524 m)   Wt 194 lb (88 kg)   SpO2 96%   BMI 37.89 kg/m   BP Readings from Last 3 Encounters:  12/12/17 140/80  11/22/17 (!) 166/88  10/23/17 124/78    Wt Readings from Last 3 Encounters:  12/12/17 194 lb (88 kg)  11/22/17 196 lb 3.2 oz (89 kg)  10/23/17 194 lb 2 oz (88.1 kg)    Physical Exam  Constitutional: She is  oriented to person, place, and time. She appears well-developed and well-nourished. No distress.  HENT:  Head: Normocephalic and atraumatic.  Right Ear: External ear normal.  Left Ear: External ear normal.  Mouth/Throat: Oropharynx is clear and moist. No oropharyngeal exudate.  Eyes: Pupils are equal, round, and reactive to light. Conjunctivae are normal. Right eye exhibits no discharge. Left eye exhibits no discharge. No scleral icterus.  Neck: Neck supple. No JVD present. No tracheal deviation present. No thyromegaly present.  Pulmonary/Chest: Effort normal. No stridor.  Abdominal: Normal appearance and bowel sounds are normal. There is tenderness in the suprapubic area. There is CVA tenderness. A hernia is present. Hernia confirmed positive in the ventral area.  Musculoskeletal:       Lumbar back: She exhibits decreased range of motion.  Lymphadenopathy:    She has no cervical adenopathy.  Neurological: She is alert and oriented to person, place, and time.  Skin: Skin is warm and dry. She is not diaphoretic.  Psychiatric: She has a normal mood and affect. Her behavior is normal.    Lab Results  Component Value Date   WBC 7.9 10/20/2016   HGB 15.5 (H) 10/20/2016   HCT 43.7 10/20/2016   PLT 196 10/20/2016   GLUCOSE 124 (H) 10/20/2016   ALT 23 10/20/2016   AST 25 10/20/2016   NA 137 10/20/2016   K 4.0 10/20/2016   CL 101 10/20/2016   CREATININE 0.54 10/20/2016   BUN 11 10/20/2016   CO2 23 10/20/2016   INR 0.99 06/25/2015    No results found.  Assessment & Plan:   [name] was seen today for back pain.  Diagnoses and all orders for this visit:  Midline low back pain without sciatica, unspecified chronicity -     Urinalysis, Routine w reflex microscopic -     POCT urinalysis dipstick -     Ambulatory referral to Neurosurgery -     MR Lumbar Spine Wo Contrast; Future -     ALPRAZolam (XANAX) 1 MG tablet; Take 1/2 to 1 tablet one prior to procedure.  Urticaria -      Ambulatory referral to Allergy -     hydrOXYzine (ATARAX/VISTARIL) 10 MG tablet; Take 1 tablet (10 mg total) by mouth 3 (three) times daily as needed.  Other microscopic hematuria -     Urine Culture -     nitrofurantoin, macrocrystal-monohydrate, (MACROBID) 100 MG capsule; Take 1 capsule (100 mg total) by mouth 2 (two) times daily for 7 days.  Lumbar post-laminectomy syndrome -     Ambulatory referral to Neurosurgery -     Discontinue: oxycodone-acetaminophen (LYNOX) 2.5-300 MG per tablet; Take 1 tablet by mouth every 8 (eight) hours as needed for up to 5 days for pain. -  oxyCODONE-acetaminophen (PERCOCET/ROXICET) 5-325 MG tablet; Take 1 tablet by mouth every 8 (eight) hours as needed for up to 7 days for severe pain. -     MR Lumbar Spine Wo Contrast; Future  Tobacco abuse   I have discontinued Annmarie Slawinski's oxycodone-acetaminophen. I am also having her start on hydrOXYzine, nitrofurantoin (macrocrystal-monohydrate), oxyCODONE-acetaminophen, and ALPRAZolam. Additionally, I am having her maintain her multivitamin, aspirin, sodium chloride, Magnesium, Melatonin, Barberry-Oreg Grape-Goldenseal (BERBERINE COMPLEX PO), OVER THE COUNTER MEDICATION, guaiFENesin, BENEFIBER DRINK MIX, ondansetron, methocarbamol, Flaxseed Oil, magnesium oxide, Ginkgo Biloba (GINKOBA PO), Apple Cider Vinegar, Alum & Mag Hydroxide-Simeth (ANTACID ANTI-GAS PO), diltiazem, mesalamine, COCONUT OIL PO, and AMBULATORY NON FORMULARY MEDICATION.  Meds ordered this encounter  Medications  . hydrOXYzine (ATARAX/VISTARIL) 10 MG tablet    Sig: Take 1 tablet (10 mg total) by mouth 3 (three) times daily as needed.    Dispense:  30 tablet    Refill:  0  . DISCONTD: oxycodone-acetaminophen (LYNOX) 2.5-300 MG per tablet    Sig: Take 1 tablet by mouth every 8 (eight) hours as needed for up to 5 days for pain.    Dispense:  30 tablet    Refill:  0  . nitrofurantoin, macrocrystal-monohydrate, (MACROBID) 100 MG capsule    Sig:  Take 1 capsule (100 mg total) by mouth 2 (two) times daily for 7 days.    Dispense:  14 capsule    Refill:  0  . oxyCODONE-acetaminophen (PERCOCET/ROXICET) 5-325 MG tablet    Sig: Take 1 tablet by mouth every 8 (eight) hours as needed for up to 7 days for severe pain.    Dispense:  30 tablet    Refill:  0  . ALPRAZolam (XANAX) 1 MG tablet    Sig: Take 1/2 to 1 tablet one prior to procedure.    Dispense:  1 tablet    Refill:  0   Patient is fully aware of the significance of hematuria with respect to her smoking history.  She will follow-up in 2 weeks to assure clearance.  This could be related to her current symptoms as well as her urticaria.  Past medical history of multiple drug allergies and she is on polypharmacy.  She is dealing with seasonal allergies.  Will ask for allergist help to sort this out.  She will see her neurosurgeon for follow-up of her chronic back pain.  Follow-up: Return in about 2 weeks (around 12/26/2017).  Libby Maw, MD

## 2017-12-12 NOTE — Telephone Encounter (Signed)
Copied from [place] [id number]. Topic: Quick Communication - Rx Refill/Question >> Dec 12, 2017  9:25 AM Boyd Kerbs wrote:  Medication: oxycodone-acetaminophen (LYNOX) 2.5-300 MG per tablet  This is not a strength [facility].  Not available  They have a percocet 5.3-25 if this would work  Has the patient contacted their pharmacy? Yes.    (Agent: If no, request that the patient contact the pharmacy for the refill.) Preferred Pharmacy (with phone number or street name):   [id number] [place] [place] - [place], [address] AT [facility] [address] [place], [address] [place] [address] Phone: [phone] Fax: [phone]   Agent: Please be advised that RX refills may take up to 3 business days. We ask that you follow-up with your pharmacy.

## 2017-12-12 NOTE — Addendum Note (Signed)
Addended by: Jon Billings on: 12/12/2017 10:11 AM   Modules accepted: Orders

## 2017-12-12 NOTE — Patient Instructions (Signed)
Hematuria, Adult Hematuria is blood in your urine. It can be caused by a bladder infection, kidney infection, prostate infection, kidney stone, or cancer of your urinary tract. Infections can usually be treated with medicine, and a kidney stone usually will pass through your urine. If neither of these is the cause of your hematuria, further workup to find out the reason may be needed. It is very important that you tell your health care provider about any blood you see in your urine, even if the blood stops without treatment or happens without causing pain. Blood in your urine that happens and then stops and then happens again can be a symptom of a very serious condition. Also, pain is not a symptom in the initial stages of many urinary cancers. Follow these instructions at home:  Drink lots of fluid, 3-4 quarts a day. If you have been diagnosed with an infection, cranberry juice is especially recommended, in addition to large amounts of water.  Avoid caffeine, tea, and carbonated beverages because they tend to irritate the bladder.  Avoid alcohol because it may irritate the prostate.  Take all medicines as directed by your health care provider.  If you were prescribed an antibiotic medicine, finish it all even if you start to feel better.  If you have been diagnosed with a kidney stone, follow your health care provider's instructions regarding straining your urine to catch the stone.  Empty your bladder often. Avoid holding urine for long periods of time.  After a bowel movement, women should cleanse front to back. Use each tissue only once.  Empty your bladder before and after sexual intercourse if you are a female. Contact a health care provider if:  You develop back pain.  You have a fever.  You have a feeling of sickness in your stomach (nausea) or vomiting.  Your symptoms are not better in 3 days. Return sooner if you are getting worse. Get help right away if:  You develop  severe vomiting and are unable to keep the medicine down.  You develop severe back or abdominal pain despite taking your medicines.  You begin passing a large amount of blood or clots in your urine.  You feel extremely weak or faint, or you pass out. This information is not intended to replace advice given to you by your health care provider. Make sure you discuss any questions you have with your health care provider. Document Released: 08/28/2005 Document Revised: 02/03/2016 Document Reviewed: 04/28/2013 Elsevier Interactive Patient Education  2017 [place] (urticaria) are itchy, red, swollen areas on your skin. Hives can appear on any part of your body and can vary in size. They can be as small as the tip of a pen or much larger. Hives often fade within 24 hours (acute hives). In other cases, new hives appear after old ones fade. This cycle can continue for several days or weeks (chronic hives). Hives result from your body's reaction to an irritant or to something that you are allergic to (trigger). When you are exposed to a trigger, your body releases a chemical (histamine) that causes redness, itching, and swelling. You can get hives immediately after being exposed to a trigger or hours later. Hives do not spread from person to person (are not contagious). Your hives may get worse with scratching, exercise, and emotional stress. What are the causes? Causes of this condition include:  Allergies to certain foods or ingredients.  Insect bites or stings.  Exposure to pollen or  pet dander.  Contact with latex or chemicals.  Spending time in sunlight, heat, or cold (exposure).  Exercise.  Stress.  You can also get hives from some medical conditions and treatments. These include:  Viruses, including the common cold.  Bacterial infections, such as urinary tract infections and strep throat.  Disorders such as vasculitis, lupus, or thyroid disease.  Certain  medications.  Allergy shots.  Blood transfusions.  Sometimes, the cause of hives is not known (idiopathic hives). What increases the risk? This condition is more likely to develop in:  Women.  People who have food allergies, especially to citrus fruits, milk, eggs, peanuts, tree nuts, or shellfish.  People who are allergic to: ? Medicines. ? Latex. ? Insects. ? Animals. ? Pollen.  People who have certain medical conditions, includinglupus or thyroid disease.  What are the signs or symptoms? The main symptom of this condition is raised, itchyred or white bumps or patches on your skin. These areas may:  Become large and swollen (welts).  Change in shape and location, quickly and repeatedly.  Be separate hives or connect over a large area of skin.  Sting or become painful.  Turn white when pressed in the center (blanch).  In severe cases, yourhands, feet, and face may also become swollen. This may occur if hives develop deeper in your skin. How is this diagnosed? This condition is diagnosed based on your symptoms, medical history, and physical exam. Your skin, urine, or blood may be tested to find out what is causing your hives and to rule out other health issues. Your health care provider may also remove a small sample of skin from the affected area and examine it under a microscope (biopsy). How is this treated? Treatment depends on the severity of your condition. Your health care provider may recommend using cool, wet cloths (cool compresses) or taking cool showers to relieve itching. Hives are sometimes treated with medicines, including:  Antihistamines.  Corticosteroids.  Antibiotics.  An injectable medicine (omalizumab). Your health care provider may prescribe this if you have chronic idiopathic hives and you continue to have symptoms even after treatment with antihistamines.  Severe cases may require an emergency injection of adrenaline (epinephrine) to  prevent a life-threatening allergic reaction (anaphylaxis). Follow these instructions at home: Medicines  Take or apply over-the-counter and prescription medicines only as told by your health care provider.  If you were prescribed an antibiotic medicine, use it as told by your health care provider. Do not stop taking the antibiotic even if you start to feel better. Skin Care  Apply cool compresses to the affected areas.  Do not scratch or rub your skin. General instructions  Do not take hot showers or baths. This can make itching worse.  Do not wear tight-fitting clothing.  Use sunscreen and wear protective clothing when you are outside.  Avoid any substances that cause your hives. Keep a journal to help you track what causes your hives. Write down: ? What medicines you take. ? What you eat and drink. ? What products you use on your skin.  Keep all follow-up visits as told by your health care provider. This is important. Contact a health care provider if:  Your symptoms are not controlled with medicine.  Your joints are painful or swollen. Get help right away if:  You have a fever.  You have pain in your abdomen.  Your tongue or lips are swollen.  Your eyelids are swollen.  Your chest or throat feels tight.  You have trouble breathing or swallowing. These symptoms may represent a serious problem that is an emergency. Do not wait to see if the symptoms will go away. Get medical help right away. Call your local emergency services (911 in the [place] Do not drive yourself to the hospital. This information is not intended to replace advice given to you by your health care provider. Make sure you discuss any questions you have with your health care provider. Document Released: 08/28/2005 Document Revised: 01/26/2016 Document Reviewed: 06/16/2015 Elsevier Interactive Patient Education  2018 [place] with Quitting Smoking Quitting smoking is a physical and  mental challenge. You will face cravings, withdrawal symptoms, and temptation. Before quitting, work with your health care provider to make a plan that can help you cope. Preparation can help you quit and keep you from giving in. How can I cope with cravings? Cravings usually last for 5-10 minutes. If you get through it, the craving will pass. Consider taking the following actions to help you cope with cravings:  Keep your mouth busy: ? Chew sugar-free gum. ? Suck on hard candies or a straw. ? Brush your teeth.  Keep your hands and body busy: ? Immediately change to a different activity when you feel a craving. ? Squeeze or play with a ball. ? Do an activity or a hobby, like making bead jewelry, practicing needlepoint, or working with wood. ? Mix up your normal routine. ? Take a short exercise break. Go for a quick walk or run up and down stairs. ? Spend time in public places where smoking is not allowed.  Focus on doing something kind or helpful for someone else.  Call a friend or family member to talk during a craving.  Join a support group.  Call a quit line, such as 1-800-QUIT-NOW.  Talk with your health care provider about medicines that might help you cope with cravings and make quitting easier for you.  How can I deal with withdrawal symptoms? Your body may experience negative effects as it tries to get used to not having nicotine in the system. These effects are called withdrawal symptoms. They may include:  Feeling hungrier than normal.  Trouble concentrating.  Irritability.  Trouble sleeping.  Feeling depressed.  Restlessness and agitation.  Craving a cigarette.  To manage withdrawal symptoms:  Avoid places, people, and activities that trigger your cravings.  Remember why you want to quit.  Get plenty of sleep.  Avoid coffee and other caffeinated drinks. These may worsen some of your symptoms.  How can I handle social situations? Social situations can  be difficult when you are quitting smoking, especially in the first few weeks. To manage this, you can:  Avoid parties, bars, and other social situations where people might be smoking.  Avoid alcohol.  Leave right away if you have the urge to smoke.  Explain to your family and friends that you are quitting smoking. Ask for understanding and support.  Plan activities with friends or family where smoking is not an option.  What are some ways I can cope with stress? Wanting to smoke may cause stress, and stress can make you want to smoke. Find ways to manage your stress. Relaxation techniques can help. For example:  Breathe slowly and deeply, in through your nose and out through your mouth.  Listen to soothing, relaxing music.  Talk with a family member or friend about your stress.  Light a candle.  Soak in a bath or take a shower.  Think about a peaceful place.  What are some ways I can prevent weight gain? Be aware that many people gain weight after they quit smoking. However, not everyone does. To keep from gaining weight, have a plan in place before you quit and stick to the plan after you quit. Your plan should include:  Having healthy snacks. When you have a craving, it may help to: ? Eat plain popcorn, crunchy carrots, celery, or other cut vegetables. ? Chew sugar-free gum.  Changing how you eat: ? Eat small portion sizes at meals. ? Eat 4-6 small meals throughout the day instead of 1-2 large meals a day. ? Be mindful when you eat. Do not watch television or do other things that might distract you as you eat.  Exercising regularly: ? Make time to exercise each day. If you do not have time for a long workout, do short bouts of exercise for 5-10 minutes several times a day. ? Do some form of strengthening exercise, like weight lifting, and some form of aerobic exercise, like running or swimming.  Drinking plenty of water or other low-calorie or no-calorie drinks. Drink  6-8 glasses of water daily, or as much as instructed by your health care provider.  Summary  Quitting smoking is a physical and mental challenge. You will face cravings, withdrawal symptoms, and temptation to smoke again. Preparation can help you as you go through these challenges.  You can cope with cravings by keeping your mouth busy (such as by chewing gum), keeping your body and hands busy, and making calls to family, friends, or a helpline for people who want to quit smoking.  You can cope with withdrawal symptoms by avoiding places where people smoke, avoiding drinks with caffeine, and getting plenty of rest.  Ask your health care provider about the different ways to prevent weight gain, avoid stress, and handle social situations. This information is not intended to replace advice given to you by your health care provider. Make sure you discuss any questions you have with your health care provider. Document Released: 08/25/2016 Document Revised: 08/25/2016 Document Reviewed: 08/25/2016 Elsevier Interactive Patient Education  [employer].

## 2017-12-12 NOTE — Telephone Encounter (Signed)
Please advise, the pharmacy doesn't carry the Oxycodone-acetaminophen 2.5-300 mg.

## 2017-12-13 LAB — URINE CULTURE
MICRO NUMBER:: 90412033
SPECIMEN QUALITY: ADEQUATE

## 2017-12-14 ENCOUNTER — Telehealth: Payer: Self-pay | Admitting: Family Medicine

## 2017-12-14 NOTE — Telephone Encounter (Signed)
Copied from [place]. Topic: Quick Communication - See Telephone Encounter >> Dec 14, 2017  5:58 PM Neva Seat wrote: Pt stating her other dr thinks she needs to have Dr. Ethelene Hal order her a MRI.  She would like to have this done on her 12-27-17 Please call pt back to discuss.

## 2017-12-17 NOTE — Telephone Encounter (Signed)
Done

## 2017-12-17 NOTE — Addendum Note (Signed)
Addended by: Abelino Derrick A on: 12/17/2017 11:08 AM   Modules accepted: Orders

## 2017-12-17 NOTE — Telephone Encounter (Signed)
I left a voicemail for patient letting her know the MRI has been ordered & she will receive a call to schedule it.

## 2017-12-17 NOTE — Telephone Encounter (Signed)
I called and spoke with patient. Dr. Arnoldo Morale, her back doctor, is wanting [place] to do an MRI of her back since she is having so much pain with it.

## 2017-12-19 ENCOUNTER — Telehealth: Payer: Self-pay | Admitting: Gastroenterology

## 2017-12-20 ENCOUNTER — Telehealth: Payer: Self-pay

## 2017-12-20 MED ORDER — ALPRAZOLAM 1 MG PO TABS: ORAL_TABLET | ORAL | 0 refills | Status: DC

## 2017-12-20 NOTE — Addendum Note (Signed)
Addended by: Abelino Derrick A on: 12/20/2017 01:05 PM   Modules accepted: Orders

## 2017-12-20 NOTE — Telephone Encounter (Signed)
Left message to call back  

## 2017-12-20 NOTE — Telephone Encounter (Signed)
Copied from [place] [id number]. Topic: General - Other >> Dec 20, 2017 12:48 PM Yvette Rack wrote: Reason for CRM: pt calling stating that she has an MRI on the 14th on Sunday and would like to have something to have her relaxed [facility]

## 2017-12-21 DIAGNOSIS — M6250 Muscle wasting and atrophy, not elsewhere classified, unspecified site: Secondary | ICD-10-CM | POA: Diagnosis not present

## 2017-12-23 ENCOUNTER — Ambulatory Visit
Admission: RE | Admit: 2017-12-23 | Discharge: 2017-12-23 | Disposition: A | Discharge status: Home / Self Care | Payer: BLUE CROSS/BLUE SHIELD | Source: Ambulatory Visit | Attending: Family Medicine | Admitting: Family Medicine

## 2017-12-23 DIAGNOSIS — M545 Low back pain, unspecified: Secondary | ICD-10-CM

## 2017-12-23 DIAGNOSIS — M5416 Radiculopathy, lumbar region: Secondary | ICD-10-CM | POA: Diagnosis not present

## 2017-12-23 DIAGNOSIS — M961 Postlaminectomy syndrome, not elsewhere classified: Secondary | ICD-10-CM

## 2017-12-25 ENCOUNTER — Ambulatory Visit: Payer: Self-pay | Admitting: Allergy and Immunology

## 2017-12-27 ENCOUNTER — Ambulatory Visit (INDEPENDENT_AMBULATORY_CARE_PROVIDER_SITE_OTHER): Payer: BLUE CROSS/BLUE SHIELD | Admitting: Family Medicine

## 2017-12-27 ENCOUNTER — Encounter: Payer: Self-pay | Admitting: Family Medicine

## 2017-12-27 VITALS — BP 136/84 | HR 100 | Ht 60.0 in | Wt 188.2 lb

## 2017-12-27 DIAGNOSIS — R937 Abnormal findings on diagnostic imaging of other parts of musculoskeletal system: Secondary | ICD-10-CM | POA: Diagnosis not present

## 2017-12-27 DIAGNOSIS — M5441 Lumbago with sciatica, right side: Secondary | ICD-10-CM

## 2017-12-27 DIAGNOSIS — M545 Low back pain, unspecified: Secondary | ICD-10-CM | POA: Insufficient documentation

## 2017-12-27 MED ORDER — METHOCARBAMOL 500 MG PO TABS: 500.0000 mg | ORAL_TABLET | Freq: Three times a day (TID) | ORAL | 0 refills | Status: DC | PRN

## 2017-12-27 MED ORDER — OXYCODONE-ACETAMINOPHEN 7.5-325 MG PO TABS: 1.0000 | ORAL_TABLET | Freq: Three times a day (TID) | ORAL | 0 refills | Status: DC | PRN

## 2017-12-27 NOTE — Progress Notes (Signed)
Subjective:  Patient ID: Barbara Dyer, female    DOB: October 28, 1965  Age: 52 y.o. MRN: [id number]  CC: Follow-up   HPI Barbara Dyer presents for follow-up of severe right lower back pain that is radiating down the back of her right leg into her foot.  Recent MRI does show evidence for a right L4 radiculopathy.  There is a question of the left L2 radiculopathy as well.  She is awaiting an appointment with her neurosurgeon.  She is having severe muscle spasms with this.  She has multiple drug allergies allergies to include rash with prednisone and nsaids.  The pain is so severe she is been losing weight because her appetite has been affected.  Outpatient Medications Prior to Visit  Medication Sig Dispense Refill  . Alum & Mag Hydroxide-Simeth (ANTACID ANTI-GAS PO) Take 1 capsule by mouth as needed.    . AMBULATORY NON FORMULARY MEDICATION Nitroglycerine ointment 0.125 %  Apply a pea sized amount internally four times daily. Dispense 30 GM zero refill 30 g 0  . Apple Cider Vinegar 188 MG CAPS Take 1 capsule by mouth daily.    [name] Kitchen aspirin 81 MG tablet Take 81 mg by mouth every other day.     Barbara Dyer Grape-Goldenseal (BERBERINE COMPLEX PO) Take 1 capsule by mouth daily.    . COCONUT OIL PO Take by mouth.    . diltiazem 2 % GEL Apply 1 application topically 2 (two) times daily. 30 g 1  . Flaxseed, Linseed, (FLAXSEED OIL) 1000 MG CAPS Take 1 capsule by mouth daily.    . Ginkgo Biloba (GINKOBA PO) Take 1 capsule by mouth daily.    [name] Kitchen guaiFENesin (MUCINEX) 600 MG 12 hr tablet Take 600 mg by mouth 2 (two) times daily as needed for cough.    . hydrOXYzine (ATARAX/VISTARIL) 10 MG tablet Take 1 tablet (10 mg total) by mouth 3 (three) times daily as needed. 30 tablet 0  . Magnesium 250 MG TABS Take 500 mg by mouth daily.     . magnesium oxide (MAG-OX) 400 MG tablet Take 400 mg by mouth daily.    . Melatonin 5 MG TABS Take 5 mg by mouth at bedtime as needed.    . mesalamine (APRISO) 0.375 g 24 hr  capsule Take 4 capsules once daily 120 capsule 1  . Multiple Vitamin (MULTIVITAMIN) tablet Take 2 tablets by mouth daily.     . ondansetron (ZOFRAN) 4 MG tablet Take 1 tablet (4 mg total) by mouth every 8 (eight) hours as needed for nausea or vomiting. 12 tablet 0  . OVER THE COUNTER MEDICATION Take 2 capsules by mouth daily. "V[employer] Fusion-Vitamin D3/Bone health/Immune System" supplement    . sodium chloride (OCEAN) 0.65 % SOLN nasal spray Place 1 spray into both nostrils as needed for congestion.    . Wheat Dextrin (BENEFIBER DRINK MIX) PACK Take 1 packet by mouth daily.    . methocarbamol (ROBAXIN) 500 MG tablet Take 1 tablet (500 mg total) by mouth at bedtime as needed for muscle spasms. 12 tablet 0  . ALPRAZolam (XANAX) 1 MG tablet Take 1/2 to 1 tablet one prior to procedure. 1 tablet 0   No facility-administered medications prior to visit.     ROS Review of Systems  Constitutional: Positive for unexpected weight change. Negative for chills, fatigue and fever.  HENT: Negative.   Respiratory: Negative.   Cardiovascular: Negative.   Gastrointestinal: Negative.   Endocrine: Negative for polyphagia and polyuria.  Genitourinary: Negative.  Musculoskeletal: Positive for back pain and myalgias.  Skin: Negative for color change and pallor.  Allergic/Immunologic: Negative for immunocompromised state.  Neurological: Negative for weakness and headaches.  Hematological: Does not bruise/bleed easily.    Objective:  BP 136/84 (BP Location: Right Arm, Patient Position: Sitting, Cuff Size: Normal)   Pulse 100   Ht 5' (1.524 m)   Wt 188 lb 4 oz (85.4 kg)   SpO2 94%   BMI 36.77 kg/m   BP Readings from Last 3 Encounters:  12/27/17 136/84  12/12/17 140/80  11/22/17 (!) 166/88    Wt Readings from Last 3 Encounters:  12/27/17 188 lb 4 oz (85.4 kg)  12/12/17 194 lb (88 kg)  11/22/17 196 lb 3.2 oz (89 kg)    Physical Exam  Lab Results  Component Value Date   WBC 7.9 10/20/2016   HGB  15.5 (H) 10/20/2016   HCT 43.7 10/20/2016   PLT 196 10/20/2016   GLUCOSE 124 (H) 10/20/2016   ALT 23 10/20/2016   AST 25 10/20/2016   NA 137 10/20/2016   K 4.0 10/20/2016   CL 101 10/20/2016   CREATININE 0.54 10/20/2016   BUN 11 10/20/2016   CO2 23 10/20/2016   INR 0.99 06/25/2015    Mr Lumbar Spine Wo Contrast  Result Date: 12/23/2017 CLINICAL DATA:  52 year old female with progressed chronic lumbar back pain for the past 2-3 months. Bilateral leg pain and numbness. Prior surgery. EXAM: MRI LUMBAR SPINE WITHOUT CONTRAST TECHNIQUE: Multiplanar, multisequence MR imaging of the lumbar spine was performed. No intravenous contrast was administered. COMPARISON:  Lumbar radiographs 11/03/2016. CT Abdomen and Pelvis 03/21/2016. FINDINGS: Segmentation:  Normal on the comparison radiographs. Alignment:  Normal lumbar lordosis. Vertebrae: L5-S1 interbody cage type implants remain in place. Mild associated hardware susceptibility artifact. Underlying normal bone marrow signal. No marrow edema or evidence of acute osseous abnormality. Intact visible sacrum and SI joints. Conus medullaris and cauda equina: Conus extends to the L1-L2 level. Conus and cauda equina appear normal. Paraspinal and other soft tissues: Mild postoperative changes to the posterior paraspinal soft tissues at L5 and S1 levels, greater on the left. No postoperative fluid collection. Negative visible abdominal viscera. Disc levels: No lower thoracic spinal stenosis. There is mild disc bulging partially visible at T10-T11. L1-L2:  Negative. L2-L3: Small foraminal to extraforaminal disc protrusion on the left abutting the exiting left L2 nerve (series 5, image 11. Mild facet hypertrophy. L3-L4: Negative aside from mild facet and ligament flavum hypertrophy. L4-L5: Mild ligament flavum and mild to moderate facet hypertrophy. Minor disc desiccation and minimal disc bulging. Capacious spinal canal. There is a superimposed small right foraminal  disc protrusion best seen on series 5, image 23. There is mild to moderate associated right L4 foraminal stenosis. No left foraminal stenosis. L5-S1: Postoperative changes with cage type interbody implants. Prior decompression. Posterior element solid arthrodesis suspected. Capacious thecal sac. Mild architectural distortion at the right lateral recess. No foraminal stenosis. IMPRESSION: 1. No lumbar spinal stenosis. Prior decompression and fusion at L5-S1 with no adverse features. 2. 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 However, there is a small right foraminal disc protrusion. Query right L4 radiculitis. 3. Small left foraminal or extraforaminal disc protrusion at L2-L3. Query left L2 radiculitis. Electronically Signed   By: Genevie Ann M.D.   On: 12/23/2017 14:14    Assessment & Plan:   Shaquille was seen today for follow-up.  Diagnoses and all orders for this visit:  Right-sided  low back pain with right-sided sciatica, unspecified chronicity -     Discontinue: methocarbamol (ROBAXIN) 500 MG tablet; Take 1 tablet (500 mg total) by mouth every 8 (eight) hours as needed for muscle spasms. -     oxyCODONE-acetaminophen (PERCOCET) 7.5-325 MG tablet; Take 1 tablet by mouth every 8 (eight) hours as needed for severe pain. -     Ambulatory referral to Neurosurgery -     methocarbamol (ROBAXIN) 500 MG tablet; Take 1 tablet (500 mg total) by mouth every 8 (eight) hours as needed for muscle spasms.  Abnormal MRI, lumbar spine -     Discontinue: methocarbamol (ROBAXIN) 500 MG tablet; Take 1 tablet (500 mg total) by mouth every 8 (eight) hours as needed for muscle spasms. -     oxyCODONE-acetaminophen (PERCOCET) 7.5-325 MG tablet; Take 1 tablet by mouth every 8 (eight) hours as needed for severe pain. -     Ambulatory referral to Neurosurgery -     methocarbamol (ROBAXIN) 500 MG tablet; Take 1 tablet (500 mg total) by mouth every 8 (eight) hours as needed for  muscle spasms.   I have discontinued Brittin Venkatesh's ALPRAZolam. I am also having her start on oxyCODONE-acetaminophen. Additionally, I am having her maintain her multivitamin, aspirin, sodium chloride, Magnesium, Melatonin, Barberry-Oreg Grape-Goldenseal (BERBERINE COMPLEX PO), OVER THE COUNTER MEDICATION, guaiFENesin, BENEFIBER DRINK MIX, ondansetron, Flaxseed Oil, magnesium oxide, Ginkgo Biloba (GINKOBA PO), Apple Cider Vinegar, Alum & Mag Hydroxide-Simeth (ANTACID ANTI-GAS PO), diltiazem, mesalamine, COCONUT OIL PO, AMBULATORY NON FORMULARY MEDICATION, hydrOXYzine, and methocarbamol.  Meds ordered this encounter  Medications  . DISCONTD: methocarbamol (ROBAXIN) 500 MG tablet    Sig: Take 1 tablet (500 mg total) by mouth every 8 (eight) hours as needed for muscle spasms.    Dispense:  30 tablet    Refill:  0  . oxyCODONE-acetaminophen (PERCOCET) 7.5-325 MG tablet    Sig: Take 1 tablet by mouth every 8 (eight) hours as needed for severe pain.    Dispense:  30 tablet    Refill:  0  . methocarbamol (ROBAXIN) 500 MG tablet    Sig: Take 1 tablet (500 mg total) by mouth every 8 (eight) hours as needed for muscle spasms.    Dispense:  30 tablet    Refill:  0   Patient's symptoms do correlate with the reading on the MRI.  Trusting that appointment with neurosurgery will be soon.  Follow-up: Return follow up is with neuro surgery.  Libby Maw, MD

## 2018-01-01 NOTE — Telephone Encounter (Signed)
Note closed.

## 2018-01-02 ENCOUNTER — Ambulatory Visit: Payer: Self-pay | Admitting: Allergy and Immunology

## 2018-01-02 ENCOUNTER — Telehealth: Payer: Self-pay | Admitting: Family Medicine

## 2018-01-02 NOTE — Telephone Encounter (Signed)
Dr. Delight Ovens called to make our office aware that Dr. Arnoldo Morale reviewed the MRI and states patient have a mild bulging disk and do not need surgery. Patient can follow up with medical management or pain management. Jacqlyn Larsen will reach out to patient to make her aware.

## 2018-01-20 DIAGNOSIS — M6250 Muscle wasting and atrophy, not elsewhere classified, unspecified site: Secondary | ICD-10-CM | POA: Diagnosis not present

## 2018-01-25 ENCOUNTER — Telehealth: Payer: Self-pay | Admitting: Family Medicine

## 2018-01-25 DIAGNOSIS — R937 Abnormal findings on diagnostic imaging of other parts of musculoskeletal system: Secondary | ICD-10-CM

## 2018-01-25 DIAGNOSIS — M5441 Lumbago with sciatica, right side: Secondary | ICD-10-CM

## 2018-01-25 MED ORDER — METHOCARBAMOL 500 MG PO TABS: 500.0000 mg | ORAL_TABLET | Freq: Three times a day (TID) | ORAL | 0 refills | Status: DC | PRN

## 2018-01-25 NOTE — Telephone Encounter (Signed)
Copied from [place] [id number]. Topic: Quick Communication - Rx Refill/Question >> Jan 25, 2018  1:23 PM Scherrie Gerlach wrote: Medication: methocarbamol (ROBAXIN) 500 MG tablet Pt states she is having severe back pain and would like to know if Dr Ethelene Hal can refill.  Pt states she cannot sleep and is miserable.  Pt also states the dr she called for referral is [facility] Address: [address] [place], [place] [address]    Phone: [phone] Pt states it has been over 2 weeks and she has not heard anything back.  Wants to know if Dr Ethelene Hal will refer for a sooner appt  [id number] - [place], [place] - [address], [address] AT [place] [phone] (Phone) [phone] (Fax)

## 2018-01-28 ENCOUNTER — Telehealth: Payer: Self-pay | Admitting: Family Medicine

## 2018-01-28 DIAGNOSIS — R937 Abnormal findings on diagnostic imaging of other parts of musculoskeletal system: Secondary | ICD-10-CM

## 2018-01-28 DIAGNOSIS — M5441 Lumbago with sciatica, right side: Secondary | ICD-10-CM

## 2018-01-28 NOTE — Telephone Encounter (Signed)
Copied from [place] [id number]. Topic: Quick Communication - Rx Refill/Question >> Jan 28, 2018 11:27 AM Scherrie Gerlach wrote: Medication: oxyCODONE-acetaminophen (PERCOCET) 7.5-325 MG tablet  [id number] - [place], [place] - [address] AT [facility] [address] [phone] (Phone) [phone] (Fax)

## 2018-01-29 DIAGNOSIS — M545 Low back pain: Secondary | ICD-10-CM | POA: Diagnosis not present

## 2018-01-29 DIAGNOSIS — S3992XA Unspecified injury of lower back, initial encounter: Secondary | ICD-10-CM | POA: Diagnosis not present

## 2018-01-29 DIAGNOSIS — S39012A Strain of muscle, fascia and tendon of lower back, initial encounter: Secondary | ICD-10-CM | POA: Diagnosis not present

## 2018-01-29 DIAGNOSIS — W01198A Fall on same level from slipping, tripping and stumbling with subsequent striking against other object, initial encounter: Secondary | ICD-10-CM | POA: Diagnosis not present

## 2018-01-29 MED ORDER — OXYCODONE-ACETAMINOPHEN 7.5-325 MG PO TABS: 1.0000 | ORAL_TABLET | Freq: Three times a day (TID) | ORAL | 0 refills | Status: DC | PRN

## 2018-01-29 NOTE — Addendum Note (Signed)
Addended by: Kateri Mc E on: 01/29/2018 08:42 AM   Modules accepted: Orders

## 2018-01-29 NOTE — Telephone Encounter (Signed)
Sent to pharmacy 

## 2018-01-30 NOTE — Telephone Encounter (Signed)
I left a voicemail for patient letting her know that Rx has been sent in.

## 2018-01-31 ENCOUNTER — Telehealth: Payer: Self-pay

## 2018-01-31 ENCOUNTER — Ambulatory Visit: Payer: BLUE CROSS/BLUE SHIELD | Admitting: Family Medicine

## 2018-01-31 ENCOUNTER — Encounter: Payer: Self-pay | Admitting: Family Medicine

## 2018-01-31 VITALS — BP 142/90 | HR 105 | Ht 60.0 in | Wt 182.5 lb

## 2018-01-31 DIAGNOSIS — M25562 Pain in left knee: Secondary | ICD-10-CM | POA: Diagnosis not present

## 2018-01-31 DIAGNOSIS — F419 Anxiety disorder, unspecified: Secondary | ICD-10-CM | POA: Diagnosis not present

## 2018-01-31 DIAGNOSIS — J301 Allergic rhinitis due to pollen: Secondary | ICD-10-CM

## 2018-01-31 DIAGNOSIS — M25561 Pain in right knee: Secondary | ICD-10-CM | POA: Insufficient documentation

## 2018-01-31 DIAGNOSIS — M545 Low back pain, unspecified: Secondary | ICD-10-CM

## 2018-01-31 DIAGNOSIS — F321 Major depressive disorder, single episode, moderate: Secondary | ICD-10-CM | POA: Diagnosis not present

## 2018-01-31 MED ORDER — FLUTICASONE PROPIONATE 50 MCG/ACT NA SUSP: 2.0000 | Freq: Every day | NASAL | 2 refills | Status: DC

## 2018-01-31 MED ORDER — DULOXETINE HCL 20 MG PO CPEP: 20.0000 mg | ORAL_CAPSULE | Freq: Two times a day (BID) | ORAL | 1 refills | Status: DC

## 2018-01-31 NOTE — Patient Instructions (Signed)
Allergies An allergy is when your body reacts to a substance in a way that is not normal. An allergic reaction can happen after you:  Eat something.  Breathe in something.  Touch something.  You can be allergic to:  Things that are only around during certain seasons, like molds and pollens.  Foods.  Drugs.  Insects.  Animal dander.  What are the signs or symptoms?  Puffiness (swelling). This may happen on the lips, face, tongue, mouth, or throat.  Sneezing.  Coughing.  Breathing loudly (wheezing).  Stuffy nose.  Tingling in the mouth.  A rash.  Itching.  Itchy, red, puffy areas of skin (hives).  Watery eyes.  Throwing up (vomiting).  Watery poop (diarrhea).  Dizziness.  Feeling faint or fainting.  Trouble breathing or swallowing.  A tight feeling in the chest.  A fast heartbeat. How is this diagnosed? Allergies can be diagnosed with:  A medical and family history.  Skin tests.  Blood tests.  A food diary. A food diary is a record of all the foods, drinks, and symptoms you have each day.  The results of an elimination diet. This diet involves making sure not to eat certain foods and then seeing what happens when you start eating them again.  How is this treated? There is no cure for allergies, but allergic reactions can be treated with medicine. Severe reactions usually need to be treated at a hospital. How is this prevented? The best way to prevent an allergic reaction is to avoid the thing you are allergic to. Allergy shots and medicines can also help prevent reactions in some cases. This information is not intended to replace advice given to you by your health care provider. Make sure you discuss any questions you have with your health care provider. Document Released: 12/23/2012 Document Revised: 04/24/2016 Document Reviewed: 06/09/2014 Elsevier Interactive Patient Education  2018 [place] With Depression Everyone  experiences occasional disappointment, sadness, and loss in their lives. When you are feeling down, blue, or sad for at least 2 weeks in a row, it may mean that you have depression. Depression can affect your thoughts and feelings, relationships, daily activities, and physical health. It is caused by changes in the way your brain functions. If you receive a diagnosis of depression, your health care provider will tell you which type of depression you have and what treatment options are available to you. If you are living with depression, there are ways to help you recover from it and also ways to prevent it from coming back. How to cope with lifestyle changes Coping with stress Stress is your body's reaction to life changes and events, both good and bad. Stressful situations may include:  Getting married.  The death of a spouse.  Losing a job.  Retiring.  Having a baby.  Stress can last just a few hours or it can be ongoing. Stress can play a major role in depression, so it is important to learn both how to cope with stress and how to think about it differently. Talk with your health care provider or a counselor if you would like to learn more about stress reduction. He or she may suggest some stress reduction techniques, such as:  Music therapy. This can include creating music or listening to music. Choose music that you enjoy and that inspires you.  Mindfulness-based meditation. This kind of meditation can be done while sitting or walking. It involves being aware of your normal breaths, rather than  trying to control your breathing.  Centering prayer. This is a kind of meditation that involves focusing on a spiritual word or phrase. Choose a word, phrase, or sacred image that is meaningful to you and that brings you peace.  Deep breathing. To do this, expand your stomach and inhale slowly through your nose. Hold your breath for 3-5 seconds, then exhale slowly, allowing your stomach muscles  to relax.  Muscle relaxation. This involves intentionally tensing muscles then relaxing them.  Choose a stress reduction technique that fits your lifestyle and personality. Stress reduction techniques take time and practice to develop. Set aside 5-15 minutes a day to do them. Therapists can offer training in these techniques. The training may be covered by some insurance plans. Other things you can do to manage stress include:  Keeping a stress diary. This can help you learn what triggers your stress and ways to control your response.  Understanding what your limits are and saying no to requests or events that lead to a schedule that is too full.  Thinking about how you respond to certain situations. You may not be able to control everything, but you can control how you react.  Adding humor to your life by watching funny films or TV shows.  Making time for activities that help you relax and not feeling guilty about spending your time this way.  Medicines Your health care provider may suggest certain medicines if he or she feels that they will help improve your condition. Avoid using alcohol and other substances that may prevent your medicines from working properly (may interact). It is also important to:  Talk with your pharmacist or health care provider about all the medicines that you take, their possible side effects, and what medicines are safe to take together.  Make it your goal to take part in all treatment decisions (shared decision-making). This includes giving input on the side effects of medicines. It is best if shared decision-making with your health care provider is part of your total treatment plan.  If your health care provider prescribes a medicine, you may not notice the full benefits of it for 4-8 weeks. Most people who are treated for depression need to be on medicine for at least 6-12 months after they feel better. If you are taking medicines as part of your treatment, do  not stop taking medicines without first talking to your health care provider. You may need to have the medicine slowly decreased (tapered) over time to decrease the risk of harmful side effects. Relationships Your health care provider may suggest family therapy along with individual therapy and drug therapy. While there may not be family problems that are causing you to feel depressed, it is still important to make sure your family learns as much as they can about your mental health. Having your family's support can help make your treatment successful. How to recognize changes in your condition Everyone has a different response to treatment for depression. Recovery from major depression happens when you have not had signs of major depression for two months. This may mean that you will start to:  Have more interest in doing activities.  Feel less hopeless than you did 2 months ago.  Have more energy.  Overeat less often, or have better or improving appetite.  Have better concentration.  Your health care provider will work with you to decide the next steps in your recovery. It is also important to recognize when your condition is getting worse. Watch for  these signs:  Having fatigue or low energy.  Eating too much or too little.  Sleeping too much or too little.  Feeling restless, agitated, or hopeless.  Having trouble concentrating or making decisions.  Having unexplained physical complaints.  Feeling irritable, angry, or aggressive.  Get help as soon as you or your family members notice these symptoms coming back. How to get support and help from others How to talk with friends and family members about your condition Talking to friends and family members about your condition can provide you with one way to get support and guidance. Reach out to trusted friends or family members, explain your symptoms to them, and let them know that you are working with a health care provider to  treat your depression. Financial resources Not all insurance plans cover mental health care, so it is important to check with your insurance carrier. If paying for co-pays or counseling services is a problem, search for a local or county mental health care center. They may be able to offer public mental health care services at low or no cost when you are not able to see a private health care provider. If you are taking medicine for depression, you may be able to get the generic form, which may be less expensive. Some makers of prescription medicines also offer help to patients who cannot afford the medicines they need. Follow these instructions at home:  Get the right amount and quality of sleep.  Cut down on using caffeine, tobacco, alcohol, and other potentially harmful substances.  Try to exercise, such as walking or lifting small weights.  Take over-the-counter and prescription medicines only as told by your health care provider.  Eat a healthy diet that includes plenty of vegetables, fruits, whole grains, low-fat dairy products, and lean protein. Do not eat a lot of foods that are high in solid fats, added sugars, or salt.  Keep all follow-up visits as told by your health care provider. This is important. Contact a health care provider if:  You stop taking your antidepressant medicines, and you have any of these symptoms: ? Nausea. ? Headache. ? Feeling lightheaded. ? Chills and body aches. ? Not being able to sleep (insomnia).  You or your friends and family think your depression is getting worse. Get help right away if:  You have thoughts of hurting yourself or others. If you ever feel like you may hurt yourself or others, or have thoughts about taking your own life, get help right away. You can go to your nearest emergency department or call:  Your local emergency services (911 in the [place]  A suicide crisis helpline, such as the [facility] at  458-002-0334. This is open 24-hours a day.  Summary  If you are living with depression, there are ways to help you recover from it and also ways to prevent it from coming back.  Work with your health care team to create a management plan that includes counseling, stress management techniques, and healthy lifestyle habits. This information is not intended to replace advice given to you by your health care provider. Make sure you discuss any questions you have with your health care provider. Document Released: 07/31/2016 Document Revised: 07/31/2016 Document Reviewed: 07/31/2016 Elsevier Interactive Patient Education  2018 [employer].  Major Depressive Disorder, Adult Major depressive disorder (MDD) is a mental health condition. It may also be called clinical depression or unipolar depression. MDD usually causes feelings of sadness, hopelessness, or helplessness. MDD can  also cause physical symptoms. It can interfere with work, school, relationships, and other everyday activities. MDD may be mild, moderate, or severe. It may occur once (single episode major depressive disorder) or it may occur multiple times (recurrent major depressive disorder). What are the causes? The exact cause of this condition is not known. MDD is most likely caused by a combination of things, which may include:  Genetic factors. These are traits that are passed along from parent to child.  Individual factors. Your personality, your behavior, and the way you handle your thoughts and feelings may contribute to MDD. This includes personality traits and behaviors learned from others.  Physical factors, such as: ? Differences in the part of your brain that controls emotion. This part of your brain may be different than it is in people who do not have MDD. ? Long-term (chronic) medical or psychiatric illnesses.  Social factors. Traumatic experiences or major life changes may play a role in the development of MDD.  What  increases the risk? This condition is more likely to develop in women. The following factors may also make you more likely to develop MDD:  A family history of depression.  Troubled family relationships.  Abnormally low levels of certain brain chemicals.  Traumatic events in childhood, especially abuse or the loss of a parent.  Being under a lot of stress, or long-term stress, especially from upsetting life experiences or losses.  A history of: ? Chronic physical illness. ? Other mental health disorders. ? Substance abuse.  Poor living conditions.  Experiencing social exclusion or discrimination on a regular basis.  What are the signs or symptoms? The main symptoms of MDD typically include:  Constant depressed or irritable mood.  Loss of interest in things and activities.  MDD symptoms may also include:  Sleeping or eating too much or too little.  Unexplained weight change.  Fatigue or low energy.  Feelings of worthlessness or guilt.  Difficulty thinking clearly or making decisions.  Thoughts of suicide or of harming others.  Physical agitation or weakness.  Isolation.  Severe cases of MDD may also occur with other symptoms, such as:  Delusions or hallucinations, in which you imagine things that are not real (psychotic depression).  Low-level depression that lasts at least a year (chronic depression or persistent depressive disorder).  Extreme sadness and hopelessness (melancholic depression).  Trouble speaking and moving (catatonic depression).  How is this diagnosed? This condition may be diagnosed based on:  Your symptoms.  Your medical history, including your mental health history. This may involve tests to evaluate your mental health. You may be asked questions about your lifestyle, including any drug and alcohol use, and how long you have had symptoms of MDD.  A physical exam.  Blood tests to rule out other conditions.  You must have a  depressed mood and at least four other MDD symptoms most of the day, nearly every day in the same 2-week timeframe before your health care provider can confirm a diagnosis of MDD. How is this treated? This condition is usually treated by mental health professionals, such as psychologists, psychiatrists, and clinical social workers. You may need more than one type of treatment. Treatment may include:  Psychotherapy. This is also called talk therapy or counseling. Types of psychotherapy include: ? Cognitive behavioral therapy (CBT). This type of therapy teaches you to recognize unhealthy feelings, thoughts, and behaviors, and replace them with positive thoughts and actions. ? Interpersonal therapy (IPT). This helps you to improve the  way you relate to and communicate with others. ? Family therapy. This treatment includes members of your family.  Medicine to treat anxiety and depression, or to help you control certain emotions and behaviors.  Lifestyle changes, such as: ? Limiting alcohol and drug use. ? Exercising regularly. ? Getting plenty of sleep. ? Making healthy eating choices. ? Spending more time outdoors.  Treatments involving stimulation of the brain can be used in situations with extremely severe symptoms, or when medicine or other therapies do not work over time. These treatments include electroconvulsive therapy, transcranial magnetic stimulation, and vagal nerve stimulation. Follow these instructions at home: Activity  Return to your normal activities as told by your health care provider.  Exercise regularly and spend time outdoors as told by your health care provider. General instructions  Take over-the-counter and prescription medicines only as told by your health care provider.  Do not drink alcohol. If you drink alcohol, limit your alcohol intake to no more than 1 drink a day for nonpregnant women and 2 drinks a day for men. One drink equals 12 oz of beer, 5 oz of wine,  or 1 oz of hard liquor. Alcohol can affect any antidepressant medicines you are taking. Talk to your health care provider about your alcohol use.  Eat a healthy diet and get plenty of sleep.  Find activities that you enjoy doing, and make time to do them.  Consider joining a support group. Your health care provider may be able to recommend a support group.  Keep all follow-up visits as told by your health care provider. This is important. Where to find more information: [employer] on Mental Illness  www.nami.org  [place] National Institute of Mental Health  https://carter.com/  National Suicide Prevention Lifeline  1-800-273-TALK 520-112-6156). This is free, 24-hour help.  Contact a health care provider if:  Your symptoms get worse.  You develop new symptoms. Get help right away if:  You self-harm.  You have serious thoughts about hurting yourself or others.  You see, hear, taste, smell, or feel things that are not present (hallucinate). This information is not intended to replace advice given to you by your health care provider. Make sure you discuss any questions you have with your health care provider. Document Released: 12/23/2012 Document Revised: 05/04/2016 Document Reviewed: 03/08/2016 Elsevier Interactive Patient Education  2018 [employer]. Duloxetine delayed-release capsules What is this medicine? DULOXETINE (doo LOX e teen) is used to treat depression, anxiety, and different types of chronic pain. This medicine may be used for other purposes; ask your health care provider or pharmacist if you have questions. COMMON BRAND NAME(S): Cymbalta, [place] What should I tell my health care provider before I take this medicine? They need to know if you have any of these conditions: -bipolar disorder or a family history of bipolar disorder -glaucoma -kidney disease -liver disease -suicidal thoughts or a previous suicide attempt -taken medicines called MAOIs like Carbex,  Eldepryl, Marplan, Nardil, and Parnate within 14 days -an unusual reaction to duloxetine, other medicines, foods, dyes, or preservatives -pregnant or trying to get pregnant -breast-feeding How should I use this medicine? Take this medicine by mouth with a glass of water. Follow the directions on the prescription label. Do not cut, crush or chew this medicine. You can take this medicine with or without food. Take your medicine at regular intervals. Do not take your medicine more often than directed. Do not stop taking this medicine suddenly except upon the advice of your doctor. Stopping  this medicine too quickly may cause serious side effects or your condition may worsen. A special MedGuide will be given to you by the pharmacist with each prescription and refill. Be sure to read this information carefully each time. Talk to your pediatrician regarding the use of this medicine in children. While this drug may be prescribed for children as young as 75 years of age for selected conditions, precautions do apply. Overdosage: If you think you have taken too much of this medicine contact a poison control center or emergency room at once. NOTE: This medicine is only for you. Do not share this medicine with others. What if I miss a dose? If you miss a dose, take it as soon as you can. If it is almost time for your next dose, take only that dose. Do not take double or extra doses. What may interact with this medicine? Do not take this medicine with any of the following medications: -desvenlafaxine -levomilnacipran -linezolid -MAOIs like Carbex, Eldepryl, Marplan, Nardil, and Parnate -methylene blue (injected into a vein) -milnacipran -thioridazine -venlafaxine This medicine may also interact with the following medications: -alcohol -amphetamines -aspirin and aspirin-like medicines -certain antibiotics like ciprofloxacin and enoxacin -certain medicines for blood pressure, heart disease, irregular  heart beat -certain medicines for depression, anxiety, or psychotic disturbances -certain medicines for migraine headache like almotriptan, eletriptan, frovatriptan, naratriptan, rizatriptan, sumatriptan, zolmitriptan -certain medicines that treat or prevent blood clots like warfarin, enoxaparin, and dalteparin -cimetidine -fentanyl -lithium -NSAIDS, medicines for pain and inflammation, like ibuprofen or naproxen -phentermine -procarbazine -rasagiline -sibutramine -St. John's wort -theophylline -tramadol -tryptophan This list may not describe all possible interactions. Give your health care provider a list of all the medicines, herbs, non-prescription drugs, or dietary supplements you use. Also tell them if you smoke, drink alcohol, or use illegal drugs. Some items may interact with your medicine. What should I watch for while using this medicine? Tell your doctor if your symptoms do not get better or if they get worse. Visit your doctor or health care professional for regular checks on your progress. Because it may take several weeks to see the full effects of this medicine, it is important to continue your treatment as prescribed by your doctor. Patients and their families should watch out for new or worsening thoughts of suicide or depression. Also watch out for sudden changes in feelings such as feeling anxious, agitated, panicky, irritable, hostile, aggressive, impulsive, severely restless, overly excited and hyperactive, or not being able to sleep. If this happens, especially at the beginning of treatment or after a change in dose, call your health care professional. Dennis Bast may get drowsy or dizzy. Do not drive, use machinery, or do anything that needs mental alertness until you know how this medicine affects you. Do not stand or sit up quickly, especially if you are an older patient. This reduces the risk of dizzy or fainting spells. Alcohol may interfere with the effect of this medicine.  Avoid alcoholic drinks. This medicine can cause an increase in blood pressure. This medicine can also cause a sudden drop in your blood pressure, which may make you feel faint and increase the chance of a fall. These effects are most common when you first start the medicine or when the dose is increased, or during use of other medicines that can cause a sudden drop in blood pressure. Check with your doctor for instructions on monitoring your blood pressure while taking this medicine. Your mouth may get dry. Chewing sugarless gum or sucking  hard candy, and drinking plenty of water may help. Contact your doctor if the problem does not go away or is severe. What side effects may I notice from receiving this medicine? Side effects that you should report to your doctor or health care professional as soon as possible: -allergic reactions like skin rash, itching or hives, swelling of the face, lips, or tongue -anxious -breathing problems -confusion -changes in vision -chest pain -confusion -elevated mood, decreased need for sleep, racing thoughts, impulsive behavior -eye pain -fast, irregular heartbeat -feeling faint or lightheaded, falls -feeling agitated, angry, or irritable -hallucination, loss of contact with reality -high blood pressure -loss of balance or coordination -palpitations -redness, blistering, peeling or loosening of the skin, including inside the mouth -restlessness, pacing, inability to keep still -seizures -stiff muscles -suicidal thoughts or other mood changes -trouble passing urine or change in the amount of urine -trouble sleeping -unusual bleeding or bruising -unusually weak or tired -vomiting -yellowing of the eyes or skin Side effects that usually do not require medical attention (report to your doctor or health care professional if they continue or are bothersome): -change in sex drive or performance -change in appetite or weight -constipation -dizziness -dry  mouth -headache -increased sweating -nausea -tired This list may not describe all possible side effects. Call your doctor for medical advice about side effects. You may report side effects to FDA at 1-800-FDA-1088. Where should I keep my medicine? Keep out of the reach of children. Store at room temperature between 20 and 25 degrees C (68 to 77 degrees F). Throw away any unused medicine after the expiration date. NOTE: This sheet is a summary. It may not cover all possible information. If you have questions about this medicine, talk to your doctor, pharmacist, or health care provider.  2018 Elsevier/Gold Standard (2016-01-27 18:16:03)

## 2018-01-31 NOTE — Progress Notes (Signed)
Subjective:  Patient ID: Barbara Dyer, female    DOB: [birth year]  Age: 52 y.o. MRN: [id number]  CC: Anxiety and Allergies   HPI Barbara Dyer presents for evaluation and possible treatment for anxiety and depression.  Ongoing pain in her knees and back seems to have gotten to where she thinks.  She reports feeling sad and blue on most days.  She has never thought about self-harm or injuring herself but does think she would sometimes be better off dead.  She says that she has never been treated for anxiety or depression.  She wants to avoid any addicting medicines such as Xanax.  She is awaiting referral to a [facility] orthopedic clinic for further treatment of her back and knee issues.  She has been experiencing watery eyes runny nose postnasal drip and sneezing.  She has been treating the symptoms with Mucinex and nasal saline.  She denies using a nasal decongestant.  Her back continues to bother her.  The pain is located across her lower back and is not currently radiating.  She does have pain moving up into her thoracic spine and in her neck area.  She denies pain that radiates around her thighs into the front side of her upper legs.  She denies saddle paresthesias or problems with bowel or bladder incontinence.  Outpatient Medications Prior to Visit  Medication Sig Dispense Refill  . Alum & Mag Hydroxide-Simeth (ANTACID ANTI-GAS PO) Take 1 capsule by mouth as needed.    . AMBULATORY NON FORMULARY MEDICATION Nitroglycerine ointment 0.125 %  Apply a pea sized amount internally four times daily. Dispense 30 GM zero refill 30 g 0  . Apple Cider Vinegar 188 MG CAPS Take 1 capsule by mouth daily.    [name] Kitchen aspirin 81 MG tablet Take 81 mg by mouth every other day.     Barbara Dyer (BERBERINE COMPLEX PO) Take 1 capsule by mouth daily.    . COCONUT OIL PO Take by mouth.    . diltiazem 2 % GEL Apply 1 application topically 2 (two) times daily. 30 g 1  . Flaxseed, Linseed, (FLAXSEED OIL)  1000 MG CAPS Take 1 capsule by mouth daily.    . Ginkgo Biloba (GINKOBA PO) Take 1 capsule by mouth daily.    [name] Kitchen guaiFENesin (MUCINEX) 600 MG 12 hr tablet Take 600 mg by mouth 2 (two) times daily as needed for cough.    . hydrOXYzine (ATARAX/VISTARIL) 10 MG tablet Take 1 tablet (10 mg total) by mouth 3 (three) times daily as needed. 30 tablet 0  . Magnesium 250 MG TABS Take 500 mg by mouth daily.     . magnesium oxide (MAG-OX) 400 MG tablet Take 400 mg by mouth daily.    . Melatonin 5 MG TABS Take 5 mg by mouth at bedtime as needed.    . mesalamine (APRISO) 0.375 g 24 hr capsule Take 4 capsules once daily 120 capsule 1  . methocarbamol (ROBAXIN) 500 MG tablet Take 1 tablet (500 mg total) by mouth every 8 (eight) hours as needed for muscle spasms. 30 tablet 0  . Multiple Vitamin (MULTIVITAMIN) tablet Take 2 tablets by mouth daily.     . ondansetron (ZOFRAN) 4 MG tablet Take 1 tablet (4 mg total) by mouth every 8 (eight) hours as needed for nausea or vomiting. 12 tablet 0  . OVER THE COUNTER MEDICATION Take 2 capsules by mouth daily. "Vita Fusion-Vitamin D3/Bone health/Immune System" supplement    . oxyCODONE-acetaminophen (PERCOCET) 7.5-325 MG tablet  Take 1 tablet by mouth every 8 (eight) hours as needed for severe pain. 30 tablet 0  . sodium chloride (OCEAN) 0.65 % SOLN nasal spray Place 1 spray into both nostrils as needed for congestion.    . Wheat Dextrin (BENEFIBER DRINK MIX) PACK Take 1 packet by mouth daily.     No facility-administered medications prior to visit.     ROS Review of Systems  Constitutional: Negative for chills, fatigue and fever.  HENT: Positive for congestion, postnasal drip and rhinorrhea. Negative for ear pain, sinus pressure, sinus pain, sore throat and voice change.   Eyes: Positive for itching. Negative for photophobia and pain.  Respiratory: Negative for cough and wheezing.   Cardiovascular: Negative.   Gastrointestinal: Negative.   Endocrine: Negative for  polyphagia and polyuria.  Genitourinary: Negative.   Musculoskeletal: Positive for arthralgias and back pain. Negative for gait problem.  Skin: Negative for rash.  Allergic/Immunologic: Negative for immunocompromised state.  Neurological: Negative for weakness and headaches.  Hematological: Does not bruise/bleed easily.  Psychiatric/Behavioral: Positive for dysphoric mood. Negative for self-injury and suicidal ideas. The patient is nervous/anxious.     Objective:  BP (!) 142/90   Pulse (!) 105   Ht 5' (1.524 m)   Wt 182 lb 8 oz (82.8 kg)   SpO2 95%   BMI 35.64 kg/m   BP Readings from Last 3 Encounters:  01/31/18 (!) 142/90  12/27/17 136/84  12/12/17 140/80    Wt Readings from Last 3 Encounters:  01/31/18 182 lb 8 oz (82.8 kg)  12/27/17 188 lb 4 oz (85.4 kg)  12/12/17 194 lb (88 kg)    Physical Exam  Constitutional: She is oriented to person, place, and time. She appears well-developed and well-nourished. No distress.  HENT:  Head: Normocephalic and atraumatic.  Right Ear: Hearing, tympanic membrane, external ear and ear canal normal.  Left Ear: Hearing, tympanic membrane, external ear and ear canal normal.  Nose: Nose normal.  Mouth/Throat: Oropharynx is clear and moist. No oropharyngeal exudate.  Eyes: Pupils are equal, round, and reactive to light. Conjunctivae and EOM are normal. Right eye exhibits no discharge. Left eye exhibits no discharge. No scleral icterus.  Neck: Normal range of motion. Neck supple. No JVD present. No tracheal deviation present. No thyromegaly present.  Cardiovascular: Normal rate, regular rhythm and normal heart sounds.  Pulmonary/Chest: Effort normal and breath sounds normal. No stridor. No respiratory distress. She has no wheezes. She has no rales.  Musculoskeletal: She exhibits no edema or deformity.       Lumbar back: She exhibits decreased range of motion.  Lymphadenopathy:    She has no cervical adenopathy.  Neurological: She is alert  and oriented to person, place, and time.  Skin: Skin is warm and dry. She is not diaphoretic.  Psychiatric: She has a normal mood and affect. Her behavior is normal.   Depression screen [facility] 2/9 01/31/2018  Decreased Interest 3  Down, Depressed, Hopeless 3  PHQ - 2 Score 6  Altered sleeping 3  Tired, decreased energy 3  Change in appetite 3  Feeling bad or failure about yourself  3  Trouble concentrating 3  Moving slowly or fidgety/restless 3  Suicidal thoughts 1  PHQ-9 Score 25     Lab Results  Component Value Date   WBC 7.9 10/20/2016   HGB 15.5 (H) 10/20/2016   HCT 43.7 10/20/2016   PLT 196 10/20/2016   GLUCOSE 124 (H) 10/20/2016   ALT 23 10/20/2016   AST  25 10/20/2016   NA 137 10/20/2016   K 4.0 10/20/2016   CL 101 10/20/2016   CREATININE 0.54 10/20/2016   BUN 11 10/20/2016   CO2 23 10/20/2016   INR 0.99 06/25/2015    Mr Lumbar Spine Wo Contrast  Result Date: 12/23/2017 CLINICAL DATA:  52 year old female with progressed chronic lumbar back pain for the past 2-3 months. Bilateral leg pain and numbness. Prior surgery. EXAM: MRI LUMBAR SPINE WITHOUT CONTRAST TECHNIQUE: Multiplanar, multisequence MR imaging of the lumbar spine was performed. No intravenous contrast was administered. COMPARISON:  Lumbar radiographs 11/03/2016. CT Abdomen and Pelvis 03/21/2016. FINDINGS: Segmentation:  Normal on the comparison radiographs. Alignment:  Normal lumbar lordosis. Vertebrae: L5-S1 interbody cage type implants remain in place. Mild associated hardware susceptibility artifact. Underlying normal bone marrow signal. No marrow edema or evidence of acute osseous abnormality. Intact visible sacrum and SI joints. Conus medullaris and cauda equina: Conus extends to the L1-L2 level. Conus and cauda equina appear normal. Paraspinal and other soft tissues: Mild postoperative changes to the posterior paraspinal soft tissues at L5 and S1 levels, greater on the left. No postoperative fluid collection.  Negative visible abdominal viscera. Disc levels: No lower thoracic spinal stenosis. There is mild disc bulging partially visible at T10-T11. L1-L2:  Negative. L2-L3: Small foraminal to extraforaminal disc protrusion on the left abutting the exiting left L2 nerve (series 5, image 11. Mild facet hypertrophy. L3-L4: Negative aside from mild facet and ligament flavum hypertrophy. L4-L5: Mild ligament flavum and mild to moderate facet hypertrophy. Minor disc desiccation and minimal disc bulging. Capacious spinal canal. There is a superimposed small right foraminal disc protrusion best seen on series 5, image 23. There is mild to moderate associated right L4 foraminal stenosis. No left foraminal stenosis. L5-S1: Postoperative changes with cage type interbody implants. Prior decompression. Posterior element solid arthrodesis suspected. Capacious thecal sac. Mild architectural distortion at the right lateral recess. No foraminal stenosis. IMPRESSION: 1. No lumbar spinal stenosis. Prior decompression and fusion at L5-S1 with no adverse features. 2. 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 However, there is a small right foraminal disc protrusion. Query right L4 radiculitis. 3. Small left foraminal or extraforaminal disc protrusion at L2-L3. Query left L2 radiculitis. Electronically Signed   By: Genevie Ann M.D.   On: 12/23/2017 14:14    Assessment & Plan:   Raeanne was seen today for anxiety and allergies.  Diagnoses and all orders for this visit:  Bilateral low back pain without sciatica, unspecified chronicity -     DULoxetine (CYMBALTA) 20 MG capsule; Take 1 capsule (20 mg total) by mouth 2 (two) times daily.  Seasonal allergic rhinitis due to pollen -     fluticasone (FLONASE) 50 MCG/ACT nasal spray; Place 2 sprays into both nostrils daily.  Anxiety -     DULoxetine (CYMBALTA) 20 MG capsule; Take 1 capsule (20 mg total) by mouth 2 (two) times daily.  Depression,  major, single episode, moderate (HCC) -     DULoxetine (CYMBALTA) 20 MG capsule; Take 1 capsule (20 mg total) by mouth 2 (two) times daily.  Pain in both knees, unspecified chronicity -     DULoxetine (CYMBALTA) 20 MG capsule; Take 1 capsule (20 mg total) by mouth 2 (two) times daily.   I am having Barbara Dyer start on fluticasone and DULoxetine. I am also having her maintain her multivitamin, aspirin, sodium chloride, Magnesium, Melatonin, Barberry-Oreg Dyer (BERBERINE COMPLEX PO), OVER THE COUNTER MEDICATION, guaiFENesin, BENEFIBER DRINK  MIX, ondansetron, Flaxseed Oil, magnesium oxide, Ginkgo Biloba (GINKOBA PO), Apple Cider Vinegar, Alum & Mag Hydroxide-Simeth (ANTACID ANTI-GAS PO), diltiazem, mesalamine, COCONUT OIL PO, AMBULATORY NON FORMULARY MEDICATION, hydrOXYzine, methocarbamol, and oxyCODONE-acetaminophen.  Meds ordered this encounter  Medications  . fluticasone (FLONASE) 50 MCG/ACT nasal spray    Sig: Place 2 sprays into both nostrils daily.    Dispense:  16 g    Refill:  2  . DULoxetine (CYMBALTA) 20 MG capsule    Sig: Take 1 capsule (20 mg total) by mouth 2 (two) times daily.    Dispense:  60 capsule    Refill:  1   Patient was smiling and engaged and maintain excellent eye contact during the course of her interview and examination.  We discussed why felt the Cymbalta was a great choice for her taking into account her comorbidities associated with her depression and anxiety.  Hopefully it will help her.  It is okay for her to use the Flonase and nasal saline together.  She will follow-up with [facility] for further treatment of her back and knee issues.  I told her I would not be comfortable with ongoing treatment for these issues and she agreed.  She will see endocrinology for diabetes in July.  Follow-up with me will be in 5 weeks for her anxiety and depression.  Follow-up: Return in about 5 weeks (around 03/07/2018).  Libby Maw, MD

## 2018-01-31 NOTE — Telephone Encounter (Signed)
Okay to fill out handicap placard for patient?   Copied from [place] [id number]. Topic: General - Other >> Jan 31, 2018  2:24 PM Neva Seat wrote: Pt is requesting a handicap sign for parking. Please call pt to let her know if it's approved.

## 2018-02-03 ENCOUNTER — Other Ambulatory Visit: Payer: Self-pay | Admitting: Family Medicine

## 2018-02-03 DIAGNOSIS — L509 Urticaria, unspecified: Secondary | ICD-10-CM

## 2018-02-06 ENCOUNTER — Telehealth: Payer: Self-pay

## 2018-02-06 ENCOUNTER — Ambulatory Visit: Payer: Self-pay | Admitting: *Deleted

## 2018-02-06 ENCOUNTER — Encounter: Payer: Self-pay | Admitting: Family Medicine

## 2018-02-06 ENCOUNTER — Ambulatory Visit: Payer: BLUE CROSS/BLUE SHIELD | Admitting: Family Medicine

## 2018-02-06 VITALS — BP 126/80 | HR 76 | Temp 98.3°F | Wt 183.2 lb

## 2018-02-06 DIAGNOSIS — R609 Edema, unspecified: Secondary | ICD-10-CM

## 2018-02-06 DIAGNOSIS — T50905A Adverse effect of unspecified drugs, medicaments and biological substances, initial encounter: Secondary | ICD-10-CM | POA: Diagnosis not present

## 2018-02-06 LAB — BASIC METABOLIC PANEL
BUN: 9 mg/dL (ref 6–23)
CALCIUM: 9.4 mg/dL (ref 8.4–10.5)
CO2: 25 meq/L (ref 19–32)
Chloride: 101 mEq/L (ref 96–112)
Creatinine, Ser: 0.52 mg/dL (ref 0.40–1.20)
GFR: 131.81 mL/min (ref >60.00)
GLUCOSE: 118 mg/dL — AB (ref 70–99)
POTASSIUM: 3.9 meq/L (ref 3.5–5.1)
Sodium: 136 mEq/L (ref 135–145)

## 2018-02-06 NOTE — Progress Notes (Signed)
Barbara Dyer - 52 y.o. female MRN [id number]  Date of birth: 08-Jan-1966  Subjective Chief Complaint  Patient presents with  . Edema    FEET AND ANKLES   . Numbness    IN BOTH HANDS    HPI Barbara Dyer is a 52 y.o. female here today for a same day acute visit with complaint of possible medication reaction.  States that last week she was started on naproxen, cyclobenzaprine (ER visit) and duloxetine(PCP).  After taking initial dose of these she began to have numbness in extremities along with hives on her arms and developed some edema in her ankles.  She stopped medication on Saturday however symptoms have not fully resolved.   She does have a history of hives with naproxen.  She did also  experience a fall about 1.5 weeks ago where she reports hitting her head and was evaluated in the ER.  She had negative lumbar spine imaging without complaint of head pain.  She denies headache, vision change, chest pain/tightness, shortness of breath, palpitations, or dizziness.    ROS: ROS completed and negative except as noted per HPI   Allergies  Allergen Reactions  . Morphine Hives  . Naproxen Sodium Hives  . Morphine And Related Hives and Other (See Comments)    Tachycardia   . Prednisone Hives and Other (See Comments)    Tachycardia (with PO steroids; has never had steroid injections)   . Advil [Ibuprofen]     sts it causes "bleeding similar to a period"  . Benadryl [Diphenhydramine] Nausea Only  . Lactose Intolerance (Gi) Diarrhea and Nausea Only  . Propoxyphene   . Sudafed [Pseudoephedrine Hcl] Nausea Only  . Tylenol [Acetaminophen] Nausea And Vomiting  . Humulin [Insulin Nph Isophane & Regular] Hives and Rash  . Lactulose Diarrhea and Nausea Only  . Sulfa Antibiotics Rash  . Tape Dermatitis    Past Medical History:  Diagnosis Date  . Arthritis   . Crohn's colitis ([place]) 2009  . Diabetes mellitus without complication (HCC)    E8B 6.4  . Hyperlipemia    Most recent LDL 94  .  Hypertension    Not previously diagnosed    Past Surgical History:  Procedure Laterality Date  . ABDOMINAL HYSTERECTOMY     plus tubes and ovaries 2nd surgery  . APPENDECTOMY    . BACK SURGERY    . BREAST SURGERY Left    bx  . CARPAL TUNNEL RELEASE Bilateral   . CHOLECYSTECTOMY    . DIAGNOSTIC LAPAROSCOPY     x3 for endometriosis  . KNEE ARTHROSCOPY Bilateral   . TOTAL KNEE ARTHROPLASTY Right 12/30/2014   Procedure: TOTAL KNEE ARTHROPLASTY;  Surgeon: Kathryne Hitch, MD;  Location: [facility];  Service: Orthopedics;  Laterality: Right;    Social History   Socioeconomic History  . Marital status: Single    Spouse name: Not on file  . Number of children: 2  . Years of education: Not on file  . Highest education level: Not on file  Occupational History  . Occupation: housewife  Social Needs  . Financial resource strain: Not on file  . Food insecurity:    Worry: Not on file    Inability: Not on file  . Transportation needs:    Medical: Not on file    Non-medical: Not on file  Tobacco Use  . Smoking status: Current Every Day Smoker    Packs/day: 1.00    Years: 25.00    Pack years: 25.00  Types: Cigarettes  . Smokeless tobacco: Never Used  . Tobacco comment: 10-05-2015  Substance and Sexual Activity  . Alcohol use: No    Alcohol/week: 0.0 oz  . Drug use: No  . Sexual activity: Not on file  Lifestyle  . Physical activity:    Days per week: Not on file    Minutes per session: Not on file  . Stress: Not on file  Relationships  . Social connections:    Talks on phone: Not on file    Gets together: Not on file    Attends religious service: Not on file    Active member of club or organization: Not on file    Attends meetings of clubs or organizations: Not on file    Relationship status: Not on file  Other Topics Concern  . Not on file  Social History Narrative   She is a married mother of 2, grandmother 59.  Smokes 1 pack a day.  Walks about 10 minutes at a time 2 days  a week.    Family History  Problem Relation Age of Onset  . Diverticulosis Mother   . Diabetes Mother   . Heart failure Mother   . Diabetes Sister   . Kidney disease Paternal Aunt   . Lung cancer Paternal Grandfather   . Heart failure Father   . Stroke Father   . Colon cancer Neg Hx   . Stomach cancer Neg Hx     Health Maintenance  Topic Date Due  . HEMOGLOBIN A1C  Jul 27, 1966  . FOOT EXAM  06/26/1976  . OPHTHALMOLOGY EXAM  06/26/1976  . URINE MICROALBUMIN  06/26/1976  . TETANUS/TDAP  06/26/1985  . PAP SMEAR  06/27/1987  . MAMMOGRAM  06/26/2016  . INFLUENZA VACCINE  04/11/2018  . COLONOSCOPY  10/11/2020  . PNEUMOCOCCAL POLYSACCHARIDE VACCINE (2) 10/23/2022  . HIV Screening  Completed    ----------------------------------------------------------------------------------------------------------------------------------------------------------------------------------------------------------------- Physical Exam BP 126/80   Pulse 76   Temp 98.3 F (36.8 C)   Wt 183 lb 3.2 oz (83.1 kg)   BMI 35.78 kg/m   Physical Exam  Constitutional: She is oriented to person, place, and time. She appears well-nourished. No distress.  HENT:  Head: Normocephalic and atraumatic.  Eyes: No scleral icterus.  Neck: Neck supple. No thyromegaly present.  Cardiovascular: Normal rate, regular rhythm and normal heart sounds.  Pulmonary/Chest: Effort normal and breath sounds normal. No stridor. No respiratory distress.  Musculoskeletal: She exhibits edema (trace).  Slow to transition to exam table due to back pain.    Lymphadenopathy:    She has no cervical adenopathy.  Neurological: She is alert and oriented to person, place, and time. She displays normal reflexes. No cranial nerve deficit or sensory deficit. She exhibits normal muscle tone. Coordination normal.  Skin:  A few scattered hives on right arm.    Psychiatric: She has a normal mood and affect. Her behavior is normal.     ------------------------------------------------------------------------------------------------------------------------------------------------------------------------------------------------------------------- Assessment and Plan  Medication reaction, initial encounter Instructed to discontinue naproxen given history of hives with this medication Will have her hold duloxetine for now as well, may be contributing to paresthesias.   Check BMP as she reports similar symptoms with low potassium in the past.   F/u with PCP in 1 week to discuss medication to replace duloxetine.

## 2018-02-06 NOTE — Assessment & Plan Note (Signed)
Instructed to discontinue naproxen given history of hives with this medication Will have her hold duloxetine for now as well, may be contributing to paresthesias.   Check BMP as she reports similar symptoms with low potassium in the past.   F/u with PCP in 1 week to discuss medication to replace duloxetine.

## 2018-02-06 NOTE — Telephone Encounter (Signed)
Okay for referral?      Copied from [place] [phone]. Topic: Referral - Request >> Feb 06, 2018  1:47 PM Oliver Pila B wrote: Reason for CRM: pt called to get a referral for a Dr. Dorathy Kinsman; chiropractor Office number: [phone]

## 2018-02-06 NOTE — Telephone Encounter (Signed)
Pt reports started new medications since 01/29/18; Cymbalta, Flonase, naproxen and flexeril. States hands, feet swollen,intermittent numbness x 1 week, mild hives, worsening Saturday. Last took medications Sunday. Nausea and vomiting this AM, 1 episode, as well as lightheadedness. Denies any swelling of tongue, dysphagia. Adamantly declined UC/ED.  Appt secured for 0845 this AM with Dr. Zigmund Daniel. Son will drive. Reason for Disposition . [1] MODERATE-SEVERE hives persist (i.e., hives interfere with normal activities or work) AND [2] taking antihistamine (e.g., Benadryl, Claritin) > 24 hours    Also with swelling of hands, feet, nausea  Answer Assessment - Initial Assessment Questions 1. APPEARANCE: "What does the rash look like?"      Hives 2. LOCATION: "Where is the rash located?"      All over     3. NUMBER: "How many hives are there?"      Mulitple 4. SIZE: "How big are the hives?" (inches, cm, compare to coins) "Do they all look the same or is there lots of variation in shape and size?"      varies 5. ONSET: "When did the hives begin?" (Hours or days ago)      1 week ago, increased Saturday 6. ITCHING: "Does it itch?" If so, ask: "How bad is the itch?"    - MILD: doesn't interfere with normal activities   - MODERATE-SEVERE: interferes with work, school, sleep, or other activities     itching 7. RECURRENT PROBLEM: "Have you had hives before?" If so, ask: "When was the last time?" and "What happened that time?"      no 8. TRIGGERS: "Were you exposed to any new food, plant, cosmetic product or animal just before the hives began?"     medication 9. OTHER SYMPTOMS: "Do you have any other symptoms?" (e.g., fever, tongue swelling, difficulty breathing, abdominal pain)     Dizziness, hands, feet swollen, numbness, nausea, vomiting this AM 10. PREGNANCY: "Is there any chance you are pregnant?" "When was your last menstrual period?"       no  Protocols used: HIVES-A-AH

## 2018-02-06 NOTE — Patient Instructions (Signed)
Stop duloxetine and naproxen for now Follow up with Dr. Ethelene Hal next week If you have continued worsening of symptoms please give [place] a call.

## 2018-02-06 NOTE — Telephone Encounter (Signed)
FYI

## 2018-02-07 NOTE — Telephone Encounter (Signed)
Patient is aware 

## 2018-02-07 NOTE — Telephone Encounter (Signed)
Doesn't need referral.

## 2018-02-11 ENCOUNTER — Other Ambulatory Visit: Payer: Self-pay | Admitting: Family Medicine

## 2018-02-11 DIAGNOSIS — R937 Abnormal findings on diagnostic imaging of other parts of musculoskeletal system: Secondary | ICD-10-CM

## 2018-02-11 DIAGNOSIS — M5441 Lumbago with sciatica, right side: Secondary | ICD-10-CM

## 2018-02-13 ENCOUNTER — Ambulatory Visit: Payer: BLUE CROSS/BLUE SHIELD | Admitting: Family Medicine

## 2018-02-19 ENCOUNTER — Encounter: Payer: Self-pay | Admitting: Family Medicine

## 2018-02-19 ENCOUNTER — Ambulatory Visit (INDEPENDENT_AMBULATORY_CARE_PROVIDER_SITE_OTHER): Payer: BLUE CROSS/BLUE SHIELD | Admitting: Family Medicine

## 2018-02-19 VITALS — BP 126/80 | HR 81 | Ht 60.0 in | Wt 178.0 lb

## 2018-02-19 DIAGNOSIS — G8929 Other chronic pain: Secondary | ICD-10-CM

## 2018-02-19 DIAGNOSIS — F321 Major depressive disorder, single episode, moderate: Secondary | ICD-10-CM | POA: Diagnosis not present

## 2018-02-19 DIAGNOSIS — Z720 Tobacco use: Secondary | ICD-10-CM

## 2018-02-19 DIAGNOSIS — T50905A Adverse effect of unspecified drugs, medicaments and biological substances, initial encounter: Secondary | ICD-10-CM | POA: Diagnosis not present

## 2018-02-19 DIAGNOSIS — M545 Low back pain: Secondary | ICD-10-CM

## 2018-02-19 NOTE — Patient Instructions (Signed)
Steps to Quit Smoking Smoking tobacco can be bad for your health. It can also affect almost every organ in your body. Smoking puts you and people around you at risk for many serious long-lasting (chronic) diseases. Quitting smoking is hard, but it is one of the best things that you can do for your health. It is never too late to quit. What are the benefits of quitting smoking? When you quit smoking, you lower your risk for getting serious diseases and conditions. They can include:  Lung cancer or lung disease.  Heart disease.  Stroke.  Heart attack.  Not being able to have children (infertility).  Weak bones (osteoporosis) and broken bones (fractures).  If you have coughing, wheezing, and shortness of breath, those symptoms may get better when you quit. You may also get sick less often. If you are pregnant, quitting smoking can help to lower your chances of having a baby of low birth weight. What can I do to help me quit smoking? Talk with your doctor about what can help you quit smoking. Some things you can do (strategies) include:  Quitting smoking totally, instead of slowly cutting back how much you smoke over a period of time.  Going to in-person counseling. You are more likely to quit if you go to many counseling sessions.  Using resources and support systems, such as: ? Online chats with a counselor. ? Phone quitlines. ? Printed self-help materials. ? Support groups or group counseling. ? Text messaging programs. ? Mobile phone apps or applications.  Taking medicines. Some of these medicines may have nicotine in them. If you are pregnant or breastfeeding, do not take any medicines to quit smoking unless your doctor says it is okay. Talk with your doctor about counseling or other things that can help you.  Talk with your doctor about using more than one strategy at the same time, such as taking medicines while you are also going to in-person counseling. This can help make  quitting easier. What things can I do to make it easier to quit? Quitting smoking might feel very hard at first, but there is a lot that you can do to make it easier. Take these steps:  Talk to your family and friends. Ask them to support and encourage you.  Call phone quitlines, reach out to support groups, or work with a counselor.  Ask people who smoke to not smoke around you.  Avoid places that make you want (trigger) to smoke, such as: ? Bars. ? Parties. ? Smoke-break areas at work.  Spend time with people who do not smoke.  Lower the stress in your life. Stress can make you want to smoke. Try these things to help your stress: ? Getting regular exercise. ? Deep-breathing exercises. ? Yoga. ? Meditating. ? Doing a body scan. To do this, close your eyes, focus on one area of your body at a time from head to toe, and notice which parts of your body are tense. Try to relax the muscles in those areas.  Download or buy apps on your mobile phone or tablet that can help you stick to your quit plan. There are many free apps, such as QuitGuide from the CDC (Centers for Disease Control and Prevention). You can find more support from smokefree.gov and other websites.  This information is not intended to replace advice given to you by your health care provider. Make sure you discuss any questions you have with your health care provider. Document Released: 06/24/2009 Document   Revised: 04/25/2016 Document Reviewed: 01/12/2015 Elsevier Interactive Patient Education  2018 [employer] Inc.  

## 2018-02-19 NOTE — Progress Notes (Signed)
Subjective:  Patient ID: Barbara Dyer, female    DOB: Jul 28, 1966  Age: 52 y.o. MRN: [id number]  CC: Follow-up   HPI Barbara Dyer presents for follow-up of an apparent allergic reaction to Naprosyn.  She developed urticaria status post taking this drug.  She also recently started Cymbalta and the pharmacist advised her to stop taking both medicines.  She continues to smoke but has decreased her usage to a half a pack a day.  She has started seeing a chiropractor for help with her lower back pain.  Patient tells me that she only uses the Percocet when her pain is a 10 out of 10.  As she was talking to me in the exam room standing without difficulty she said that her pain was 8 out of 10. Outpatient Medications Prior to Visit  Medication Sig Dispense Refill  . Alum & Mag Hydroxide-Simeth (ANTACID ANTI-GAS PO) Take 1 capsule by mouth as needed.    . AMBULATORY NON FORMULARY MEDICATION Nitroglycerine ointment 0.125 %  Apply a pea sized amount internally four times daily. Dispense 30 GM zero refill 30 g 0  . Apple Cider Vinegar 188 MG CAPS Take 1 capsule by mouth daily.    [name] Kitchen aspirin 81 MG tablet Take 81 mg by mouth every other day.     Barbara Dyer (BERBERINE COMPLEX PO) Take 1 capsule by mouth daily.    . cyclobenzaprine (FLEXERIL) 10 MG tablet TAKE 1 TABLET BY MOUTH 2 (TWO) TIMES DAILY AS NEEDED FOR UP TO 10 DAYS FOR MUSCLE SPASMS.  0  . diltiazem 2 % GEL Apply 1 application topically 2 (two) times daily. 30 g 1  . Flaxseed, Linseed, (FLAXSEED OIL) 1000 MG CAPS Take 1 capsule by mouth daily.    . fluticasone (FLONASE) 50 MCG/ACT nasal spray Place 2 sprays into both nostrils daily. 16 g 2  . Ginkgo Biloba (GINKOBA PO) Take 1 capsule by mouth daily.    [name] Kitchen guaiFENesin (MUCINEX) 600 MG 12 hr tablet Take 600 mg by mouth 2 (two) times daily as needed for cough.    . hydrOXYzine (ATARAX/VISTARIL) 10 MG tablet TAKE 1 TABLET BY MOUTH THREE TIMES A DAY AS NEEDED 30 tablet 0  . Melatonin  5 MG TABS Take 5 mg by mouth at bedtime as needed.    . mesalamine (APRISO) 0.375 g 24 hr capsule Take 4 capsules once daily 120 capsule 1  . methocarbamol (ROBAXIN) 500 MG tablet TAKE 1 TABLET (500 MG TOTAL) BY MOUTH EVERY 8 (EIGHT) HOURS AS NEEDED FOR MUSCLE SPASMS. 30 tablet 0  . Multiple Vitamin (MULTIVITAMIN) tablet Take 2 tablets by mouth daily.     [name] Kitchen OVER THE COUNTER MEDICATION Take 2 capsules by mouth daily. "Vita Fusion-Vitamin D3/Bone health/Immune System" supplement    . oxyCODONE-acetaminophen (PERCOCET) 7.5-325 MG tablet Take 1 tablet by mouth every 8 (eight) hours as needed for severe pain. 30 tablet 0  . PAPAYA ENZYME PO Take by mouth.    . sodium chloride (OCEAN) 0.65 % SOLN nasal spray Place 1 spray into both nostrils as needed for congestion.    . DULoxetine (CYMBALTA) 20 MG capsule Take 1 capsule (20 mg total) by mouth 2 (two) times daily. (Patient not taking: Reported on 02/19/2018) 60 capsule 1  . naproxen (NAPROSYN) 500 MG tablet TAKE 1 TABLET (500 MG TOTAL) BY MOUTH 2 TIMES DAILY WITH MEALS.  0   No facility-administered medications prior to visit.     ROS Review of Systems  Constitutional:  Negative for chills, fatigue, fever and unexpected weight change.  HENT: Negative.   Eyes: Negative.   Respiratory: Negative.  Negative for chest tightness and shortness of breath.   Cardiovascular: Negative for chest pain and palpitations.  Gastrointestinal: Negative.   Endocrine: Negative for polyphagia and polyuria.  Genitourinary: Negative for difficulty urinating and frequency.  Musculoskeletal: Positive for back pain.  Skin: Negative for pallor and rash.  Neurological: Negative for facial asymmetry, weakness and light-headedness.  Hematological: Does not bruise/bleed easily.  Psychiatric/Behavioral: Negative.     Objective:  BP 126/80   Pulse 81   Ht 5' (1.524 m)   Wt 178 lb (80.7 kg)   SpO2 97%   BMI 34.76 kg/m   BP Readings from Last 3 Encounters:  02/19/18  126/80  02/06/18 126/80  01/31/18 (!) 142/90    Wt Readings from Last 3 Encounters:  02/19/18 178 lb (80.7 kg)  02/06/18 183 lb 3.2 oz (83.1 kg)  01/31/18 182 lb 8 oz (82.8 kg)    Physical Exam  Constitutional: She is oriented to person, place, and time. She appears well-developed and well-nourished. No distress.  HENT:  Head: Normocephalic and atraumatic.  Right Ear: External ear normal.  Left Ear: External ear normal.  Mouth/Throat: Oropharynx is clear and moist. No oropharyngeal exudate.  Eyes: Pupils are equal, round, and reactive to light. Conjunctivae and EOM are normal. Right eye exhibits no discharge. Left eye exhibits no discharge. No scleral icterus.  Neck: No JVD present. No tracheal deviation present.  Cardiovascular: Normal rate, regular rhythm and normal heart sounds.  Pulmonary/Chest: Effort normal and breath sounds normal.  Abdominal: Bowel sounds are normal.  Musculoskeletal: She exhibits no edema.  Neurological: She is alert and oriented to person, place, and time.  Skin: No rash noted. She is not diaphoretic. No erythema. No pallor.  Psychiatric: She has a normal mood and affect. Her behavior is normal.    Lab Results  Component Value Date   WBC 7.9 10/20/2016   HGB 15.5 (H) 10/20/2016   HCT 43.7 10/20/2016   PLT 196 10/20/2016   GLUCOSE 118 (H) 02/06/2018   ALT 23 10/20/2016   AST 25 10/20/2016   NA 136 02/06/2018   K 3.9 02/06/2018   CL 101 02/06/2018   CREATININE 0.52 02/06/2018   BUN 9 02/06/2018   CO2 25 02/06/2018   INR 0.99 06/25/2015    Mr Lumbar Spine Wo Contrast  Result Date: 12/23/2017 CLINICAL DATA:  52 year old female with progressed chronic lumbar back pain for the past 2-3 months. Bilateral leg pain and numbness. Prior surgery. EXAM: MRI LUMBAR SPINE WITHOUT CONTRAST TECHNIQUE: Multiplanar, multisequence MR imaging of the lumbar spine was performed. No intravenous contrast was administered. COMPARISON:  Lumbar radiographs 11/03/2016.  CT Abdomen and Pelvis 03/21/2016. FINDINGS: Segmentation:  Normal on the comparison radiographs. Alignment:  Normal lumbar lordosis. Vertebrae: L5-S1 interbody cage type implants remain in place. Mild associated hardware susceptibility artifact. Underlying normal bone marrow signal. No marrow edema or evidence of acute osseous abnormality. Intact visible sacrum and SI joints. Conus medullaris and cauda equina: Conus extends to the L1-L2 level. Conus and cauda equina appear normal. Paraspinal and other soft tissues: Mild postoperative changes to the posterior paraspinal soft tissues at L5 and S1 levels, greater on the left. No postoperative fluid collection. Negative visible abdominal viscera. Disc levels: No lower thoracic spinal stenosis. There is mild disc bulging partially visible at T10-T11. L1-L2:  Negative. L2-L3: Small foraminal to extraforaminal disc protrusion on the  left abutting the exiting left L2 nerve (series 5, image 11. Mild facet hypertrophy. L3-L4: Negative aside from mild facet and ligament flavum hypertrophy. L4-L5: Mild ligament flavum and mild to moderate facet hypertrophy. Minor disc desiccation and minimal disc bulging. Capacious spinal canal. There is a superimposed small right foraminal disc protrusion best seen on series 5, image 23. There is mild to moderate associated right L4 foraminal stenosis. No left foraminal stenosis. L5-S1: Postoperative changes with cage type interbody implants. Prior decompression. Posterior element solid arthrodesis suspected. Capacious thecal sac. Mild architectural distortion at the right lateral recess. No foraminal stenosis. IMPRESSION: 1. No lumbar spinal stenosis. Prior decompression and fusion at L5-S1 with no adverse features. 2. 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 However, there is a small right foraminal disc protrusion. Query right L4 radiculitis. 3. Small left foraminal or extraforaminal disc  protrusion at L2-L3. Query left L2 radiculitis. Electronically Signed   By: Genevie Ann M.D.   On: 12/23/2017 14:14    Assessment & Plan:   Bo was seen today for follow-up.  Diagnoses and all orders for this visit:  Tobacco abuse  Depression, major, single episode, moderate (HCC)  Medication reaction, initial encounter  Chronic bilateral low back pain without sciatica   I have discontinued Channell Kerlin's naproxen. I am also having her maintain her multivitamin, aspirin, sodium chloride, Melatonin, Barberry-Oreg Dyer (BERBERINE COMPLEX PO), OVER THE COUNTER MEDICATION, guaiFENesin, Flaxseed Oil, Ginkgo Biloba (GINKOBA PO), Apple Cider Vinegar, Alum & Mag Hydroxide-Simeth (ANTACID ANTI-GAS PO), diltiazem, mesalamine, AMBULATORY NON FORMULARY MEDICATION, oxyCODONE-acetaminophen, fluticasone, DULoxetine, hydrOXYzine, PAPAYA ENZYME PO, cyclobenzaprine, and methocarbamol.  No orders of the defined types were placed in this encounter.  Patient will will restart the duloxetine.  She will follow-up in 1 month.  Hopefully this will help with her depression and chronic back pain.  She will stop it if she develops any rash.  Once again encouraged her to stop smoking.  Discussed the fact that she is at higher risk for heart disease with diabetes and that tobacco usage amplifies that risk.  Suggested that we may need to involve pain management at some point in the future pending the results of duloxetine and chiropractic care.  Follow-up: Return in about 1 month (around 03/21/2018).  Libby Maw, MD

## 2018-02-20 DIAGNOSIS — M5441 Lumbago with sciatica, right side: Secondary | ICD-10-CM | POA: Diagnosis not present

## 2018-02-20 DIAGNOSIS — M6250 Muscle wasting and atrophy, not elsewhere classified, unspecified site: Secondary | ICD-10-CM | POA: Diagnosis not present

## 2018-02-20 DIAGNOSIS — R201 Hypoesthesia of skin: Secondary | ICD-10-CM | POA: Diagnosis not present

## 2018-02-20 DIAGNOSIS — M9903 Segmental and somatic dysfunction of lumbar region: Secondary | ICD-10-CM | POA: Diagnosis not present

## 2018-02-21 DIAGNOSIS — R201 Hypoesthesia of skin: Secondary | ICD-10-CM | POA: Diagnosis not present

## 2018-02-21 DIAGNOSIS — M9903 Segmental and somatic dysfunction of lumbar region: Secondary | ICD-10-CM | POA: Diagnosis not present

## 2018-02-21 DIAGNOSIS — M5441 Lumbago with sciatica, right side: Secondary | ICD-10-CM | POA: Diagnosis not present

## 2018-02-25 DIAGNOSIS — R201 Hypoesthesia of skin: Secondary | ICD-10-CM | POA: Diagnosis not present

## 2018-02-25 DIAGNOSIS — M9903 Segmental and somatic dysfunction of lumbar region: Secondary | ICD-10-CM | POA: Diagnosis not present

## 2018-02-25 DIAGNOSIS — M5441 Lumbago with sciatica, right side: Secondary | ICD-10-CM | POA: Diagnosis not present

## 2018-02-27 DIAGNOSIS — M9903 Segmental and somatic dysfunction of lumbar region: Secondary | ICD-10-CM | POA: Diagnosis not present

## 2018-02-27 DIAGNOSIS — R201 Hypoesthesia of skin: Secondary | ICD-10-CM | POA: Diagnosis not present

## 2018-02-27 DIAGNOSIS — M5441 Lumbago with sciatica, right side: Secondary | ICD-10-CM | POA: Diagnosis not present

## 2018-02-28 DIAGNOSIS — R201 Hypoesthesia of skin: Secondary | ICD-10-CM | POA: Diagnosis not present

## 2018-02-28 DIAGNOSIS — M9903 Segmental and somatic dysfunction of lumbar region: Secondary | ICD-10-CM | POA: Diagnosis not present

## 2018-02-28 DIAGNOSIS — M5441 Lumbago with sciatica, right side: Secondary | ICD-10-CM | POA: Diagnosis not present

## 2018-03-04 DIAGNOSIS — R201 Hypoesthesia of skin: Secondary | ICD-10-CM | POA: Diagnosis not present

## 2018-03-04 DIAGNOSIS — M5441 Lumbago with sciatica, right side: Secondary | ICD-10-CM | POA: Diagnosis not present

## 2018-03-04 DIAGNOSIS — M9903 Segmental and somatic dysfunction of lumbar region: Secondary | ICD-10-CM | POA: Diagnosis not present

## 2018-03-06 DIAGNOSIS — M5441 Lumbago with sciatica, right side: Secondary | ICD-10-CM | POA: Diagnosis not present

## 2018-03-06 DIAGNOSIS — M9903 Segmental and somatic dysfunction of lumbar region: Secondary | ICD-10-CM | POA: Diagnosis not present

## 2018-03-06 DIAGNOSIS — R201 Hypoesthesia of skin: Secondary | ICD-10-CM | POA: Diagnosis not present

## 2018-03-08 ENCOUNTER — Ambulatory Visit: Payer: BLUE CROSS/BLUE SHIELD | Admitting: Family Medicine

## 2018-03-21 ENCOUNTER — Ambulatory Visit: Payer: BLUE CROSS/BLUE SHIELD | Admitting: Family Medicine

## 2018-03-21 DIAGNOSIS — R201 Hypoesthesia of skin: Secondary | ICD-10-CM | POA: Diagnosis not present

## 2018-03-21 DIAGNOSIS — M9903 Segmental and somatic dysfunction of lumbar region: Secondary | ICD-10-CM | POA: Diagnosis not present

## 2018-03-21 DIAGNOSIS — M5441 Lumbago with sciatica, right side: Secondary | ICD-10-CM | POA: Diagnosis not present

## 2018-03-22 DIAGNOSIS — M6250 Muscle wasting and atrophy, not elsewhere classified, unspecified site: Secondary | ICD-10-CM | POA: Diagnosis not present

## 2018-03-26 ENCOUNTER — Ambulatory Visit: Payer: BLUE CROSS/BLUE SHIELD | Admitting: Cardiology

## 2018-03-27 DIAGNOSIS — M5441 Lumbago with sciatica, right side: Secondary | ICD-10-CM | POA: Diagnosis not present

## 2018-03-27 DIAGNOSIS — M9903 Segmental and somatic dysfunction of lumbar region: Secondary | ICD-10-CM | POA: Diagnosis not present

## 2018-03-27 DIAGNOSIS — R201 Hypoesthesia of skin: Secondary | ICD-10-CM | POA: Diagnosis not present

## 2018-03-28 DIAGNOSIS — M9903 Segmental and somatic dysfunction of lumbar region: Secondary | ICD-10-CM | POA: Diagnosis not present

## 2018-03-28 DIAGNOSIS — M5441 Lumbago with sciatica, right side: Secondary | ICD-10-CM | POA: Diagnosis not present

## 2018-03-28 DIAGNOSIS — R201 Hypoesthesia of skin: Secondary | ICD-10-CM | POA: Diagnosis not present

## 2018-04-01 DIAGNOSIS — R201 Hypoesthesia of skin: Secondary | ICD-10-CM | POA: Diagnosis not present

## 2018-04-01 DIAGNOSIS — M5441 Lumbago with sciatica, right side: Secondary | ICD-10-CM | POA: Diagnosis not present

## 2018-04-01 DIAGNOSIS — M9903 Segmental and somatic dysfunction of lumbar region: Secondary | ICD-10-CM | POA: Diagnosis not present

## 2018-04-03 DIAGNOSIS — M5441 Lumbago with sciatica, right side: Secondary | ICD-10-CM | POA: Diagnosis not present

## 2018-04-03 DIAGNOSIS — M9903 Segmental and somatic dysfunction of lumbar region: Secondary | ICD-10-CM | POA: Diagnosis not present

## 2018-04-03 DIAGNOSIS — R201 Hypoesthesia of skin: Secondary | ICD-10-CM | POA: Diagnosis not present

## 2018-04-04 DIAGNOSIS — R201 Hypoesthesia of skin: Secondary | ICD-10-CM | POA: Diagnosis not present

## 2018-04-04 DIAGNOSIS — M9903 Segmental and somatic dysfunction of lumbar region: Secondary | ICD-10-CM | POA: Diagnosis not present

## 2018-04-04 DIAGNOSIS — M5441 Lumbago with sciatica, right side: Secondary | ICD-10-CM | POA: Diagnosis not present

## 2018-04-08 DIAGNOSIS — M9903 Segmental and somatic dysfunction of lumbar region: Secondary | ICD-10-CM | POA: Diagnosis not present

## 2018-04-08 DIAGNOSIS — R201 Hypoesthesia of skin: Secondary | ICD-10-CM | POA: Diagnosis not present

## 2018-04-08 DIAGNOSIS — M5441 Lumbago with sciatica, right side: Secondary | ICD-10-CM | POA: Diagnosis not present

## 2018-04-11 DIAGNOSIS — M5441 Lumbago with sciatica, right side: Secondary | ICD-10-CM | POA: Diagnosis not present

## 2018-04-11 DIAGNOSIS — M9903 Segmental and somatic dysfunction of lumbar region: Secondary | ICD-10-CM | POA: Diagnosis not present

## 2018-04-11 DIAGNOSIS — R201 Hypoesthesia of skin: Secondary | ICD-10-CM | POA: Diagnosis not present

## 2018-04-15 DIAGNOSIS — M9903 Segmental and somatic dysfunction of lumbar region: Secondary | ICD-10-CM | POA: Diagnosis not present

## 2018-04-15 DIAGNOSIS — R201 Hypoesthesia of skin: Secondary | ICD-10-CM | POA: Diagnosis not present

## 2018-04-15 DIAGNOSIS — M5441 Lumbago with sciatica, right side: Secondary | ICD-10-CM | POA: Diagnosis not present

## 2018-04-17 DIAGNOSIS — M5441 Lumbago with sciatica, right side: Secondary | ICD-10-CM | POA: Diagnosis not present

## 2018-04-17 DIAGNOSIS — M9903 Segmental and somatic dysfunction of lumbar region: Secondary | ICD-10-CM | POA: Diagnosis not present

## 2018-04-17 DIAGNOSIS — R201 Hypoesthesia of skin: Secondary | ICD-10-CM | POA: Diagnosis not present

## 2018-04-18 DIAGNOSIS — M5441 Lumbago with sciatica, right side: Secondary | ICD-10-CM | POA: Diagnosis not present

## 2018-04-18 DIAGNOSIS — R201 Hypoesthesia of skin: Secondary | ICD-10-CM | POA: Diagnosis not present

## 2018-04-18 DIAGNOSIS — M9903 Segmental and somatic dysfunction of lumbar region: Secondary | ICD-10-CM | POA: Diagnosis not present

## 2018-04-22 DIAGNOSIS — M6250 Muscle wasting and atrophy, not elsewhere classified, unspecified site: Secondary | ICD-10-CM | POA: Diagnosis not present

## 2018-04-24 DIAGNOSIS — R201 Hypoesthesia of skin: Secondary | ICD-10-CM | POA: Diagnosis not present

## 2018-04-24 DIAGNOSIS — M9903 Segmental and somatic dysfunction of lumbar region: Secondary | ICD-10-CM | POA: Diagnosis not present

## 2018-04-24 DIAGNOSIS — M5441 Lumbago with sciatica, right side: Secondary | ICD-10-CM | POA: Diagnosis not present

## 2018-05-01 DIAGNOSIS — M9903 Segmental and somatic dysfunction of lumbar region: Secondary | ICD-10-CM | POA: Diagnosis not present

## 2018-05-01 DIAGNOSIS — R201 Hypoesthesia of skin: Secondary | ICD-10-CM | POA: Diagnosis not present

## 2018-05-01 DIAGNOSIS — M5441 Lumbago with sciatica, right side: Secondary | ICD-10-CM | POA: Diagnosis not present

## 2018-05-08 DIAGNOSIS — M5441 Lumbago with sciatica, right side: Secondary | ICD-10-CM | POA: Diagnosis not present

## 2018-05-08 DIAGNOSIS — R201 Hypoesthesia of skin: Secondary | ICD-10-CM | POA: Diagnosis not present

## 2018-05-08 DIAGNOSIS — M9903 Segmental and somatic dysfunction of lumbar region: Secondary | ICD-10-CM | POA: Diagnosis not present

## 2018-05-15 DIAGNOSIS — M5441 Lumbago with sciatica, right side: Secondary | ICD-10-CM | POA: Diagnosis not present

## 2018-05-15 DIAGNOSIS — M9903 Segmental and somatic dysfunction of lumbar region: Secondary | ICD-10-CM | POA: Diagnosis not present

## 2018-05-15 DIAGNOSIS — R201 Hypoesthesia of skin: Secondary | ICD-10-CM | POA: Diagnosis not present

## 2018-05-23 DIAGNOSIS — M6250 Muscle wasting and atrophy, not elsewhere classified, unspecified site: Secondary | ICD-10-CM | POA: Diagnosis not present

## 2018-06-22 DIAGNOSIS — M6250 Muscle wasting and atrophy, not elsewhere classified, unspecified site: Secondary | ICD-10-CM | POA: Diagnosis not present

## 2019-01-02 ENCOUNTER — Other Ambulatory Visit: Payer: Self-pay | Admitting: Family Medicine

## 2019-01-02 DIAGNOSIS — J301 Allergic rhinitis due to pollen: Secondary | ICD-10-CM

## 2019-01-03 ENCOUNTER — Telehealth: Payer: Self-pay | Admitting: Family Medicine

## 2019-01-03 NOTE — Telephone Encounter (Signed)
Called pt on behalf of Dr Ethelene Hal to see if she would be interested in a virtual follow up visit, left message

## 2019-06-28 IMAGING — MR MR LUMBAR SPINE W/O CM
5 series · 37 of 48 positions shown · non-contrast
Comparison: Lumbar radiographs 11/03/2016. CT Abdomen and Pelvis
03/21/2016.

CLINICAL DATA: 51-year-old female with progressed chronic lumbar
back pain for the past 2-3 months. Bilateral leg pain and numbness.
Prior surgery.

EXAM:
MRI LUMBAR SPINE WITHOUT CONTRAST
TECHNIQUE: Multiplanar, multisequence MR imaging of the lumbar spine was
performed. No intravenous contrast was administered.

[Series 2: T1 · sagittal · 4.0mm · 1.02mm/px · 6 of 13 slices shown (1 of 2)]
[im 1/13]
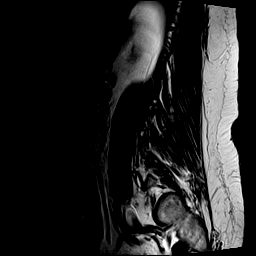
[im 3/13]
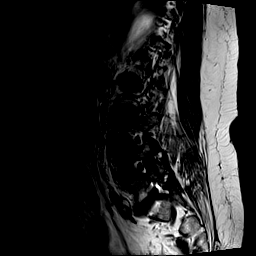
[im 5/13]
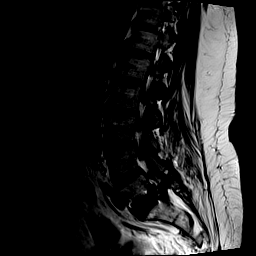
[im 8/13]
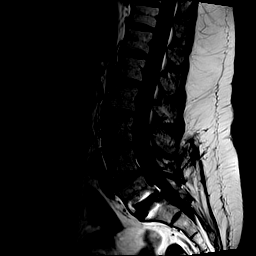
[im 10/13]
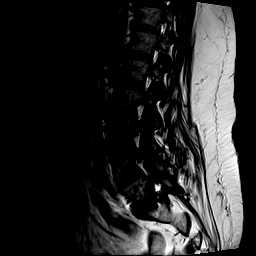
[im 13/13]
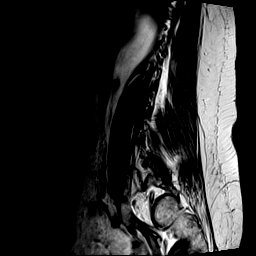

[Series 3: T2 · sagittal · 4.0mm · 1.02mm/px · 6 of 13 slices shown (1 of 2)]
[im 1/13]
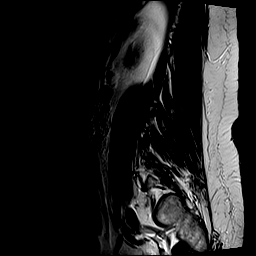
[im 3/13]
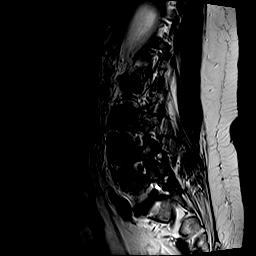
[im 5/13]
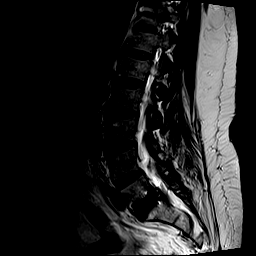
[im 8/13]
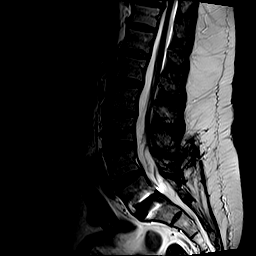
[im 10/13]
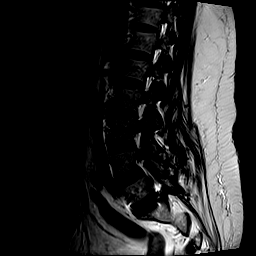
[im 13/13]
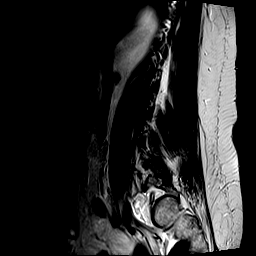

[Series 4: STIR · sagittal · 4.0mm · 1.02mm/px · 6 of 13 slices shown]
[im 1/13]
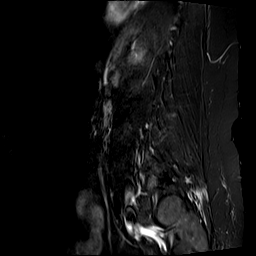
[im 3/13]
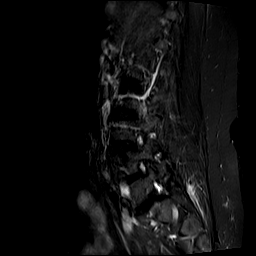
[im 5/13]
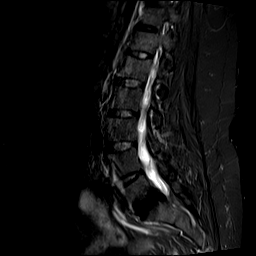
[im 8/13]
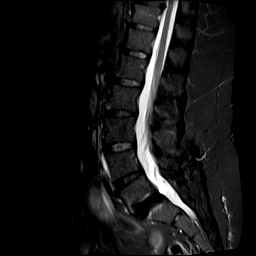
[im 10/13]
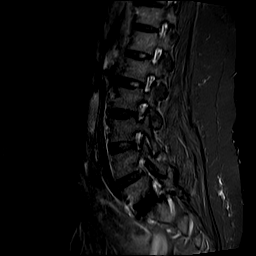
[im 13/13]
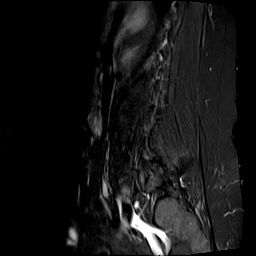

[Series 5: T2 · axial · 4.0mm · 0.39mm/px · z∈[-116,+84]mm · 10 of 34 slices shown (2 of 2)]
[im 1/34]
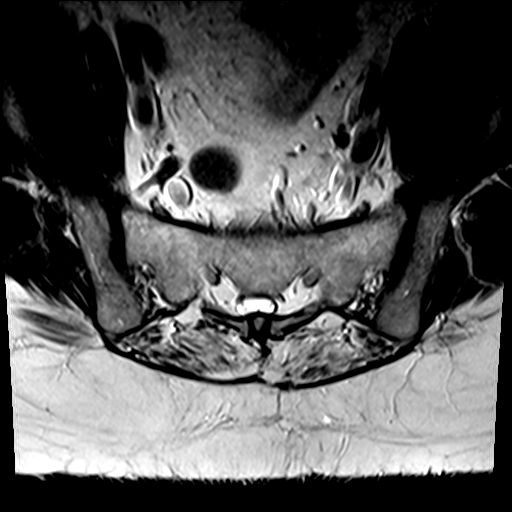
[im 3/34]
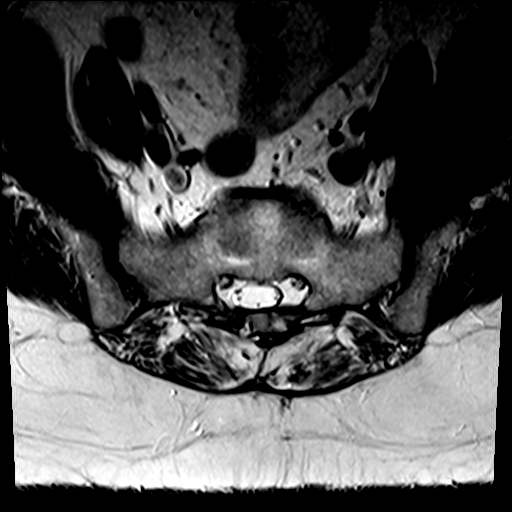
[im 5/34]
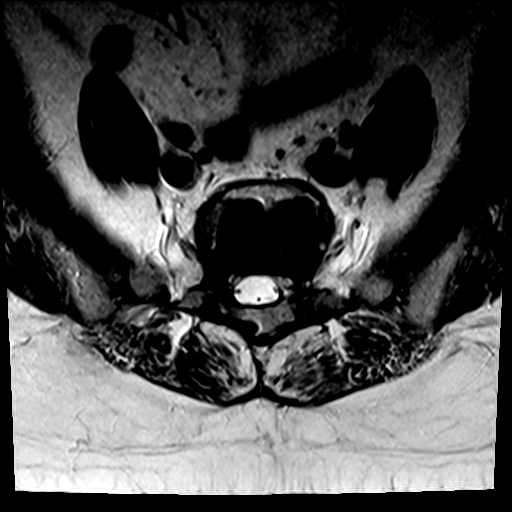
[im 10/34]
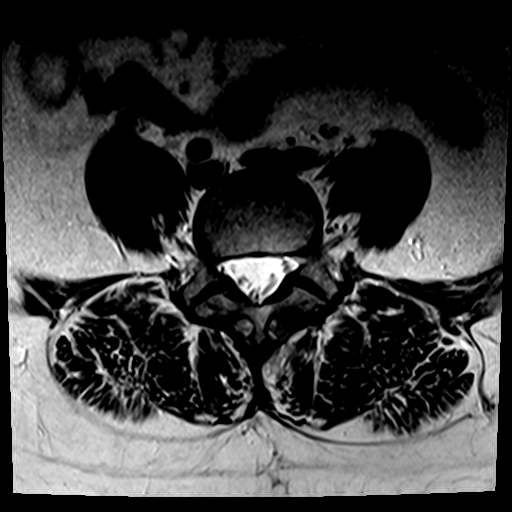
[im 15/34]
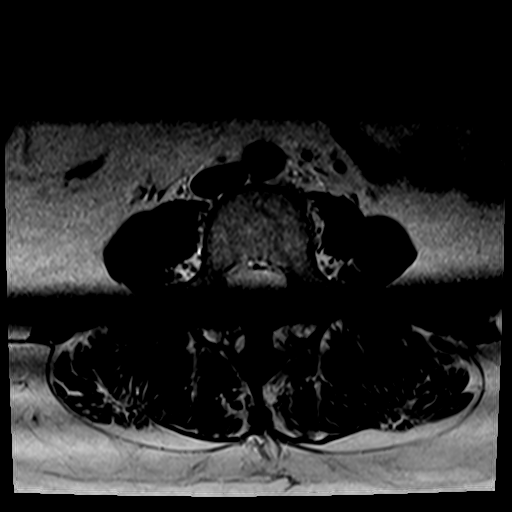
[im 17/34]
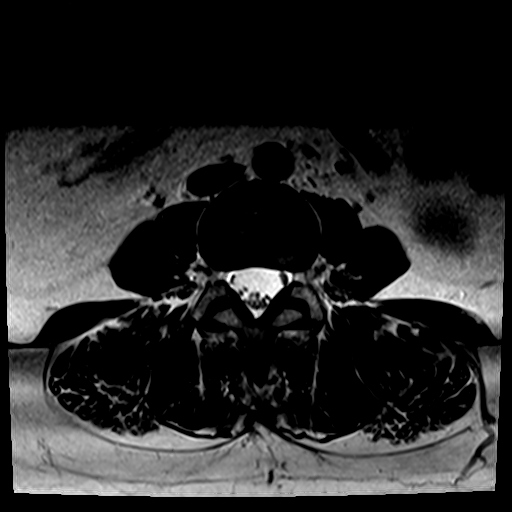
[im 19/34]
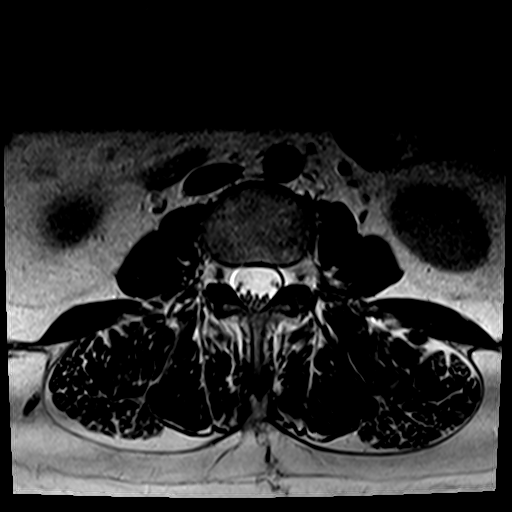
[im 24/34]
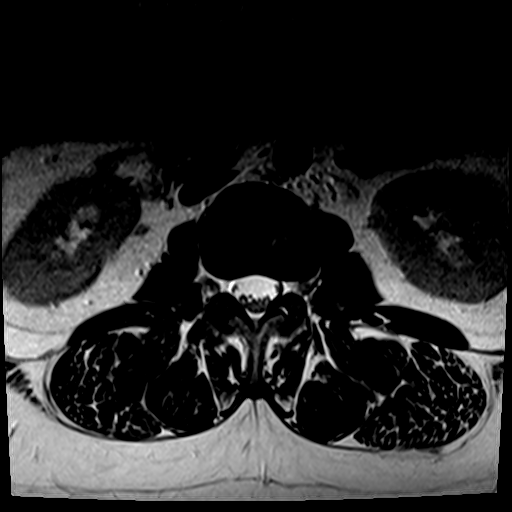
[im 29/34]
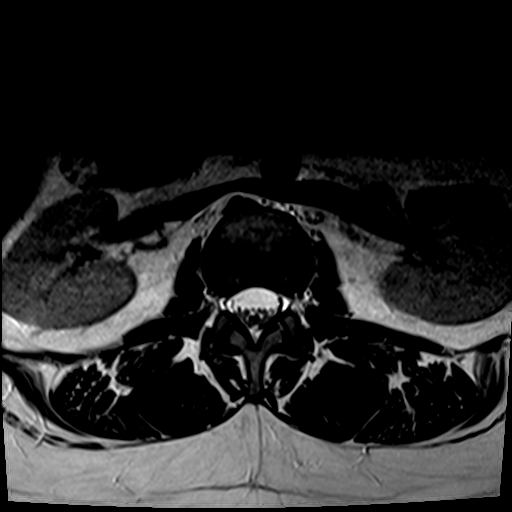
[im 34/34]
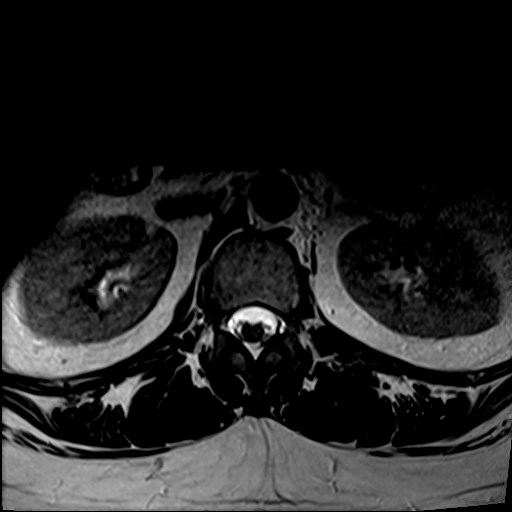

[Series 6: T1 · axial · 4.0mm · 0.78mm/px · z∈[-116,+84]mm · 9 of 34 slices shown (2 of 2)]
[im 1/34]
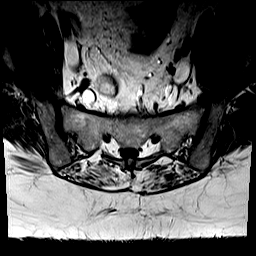
[im 5/34]
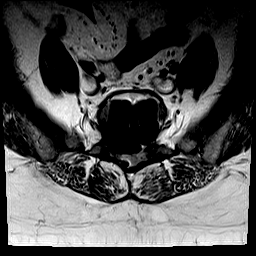
[im 10/34]
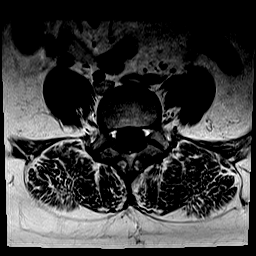
[im 15/34]
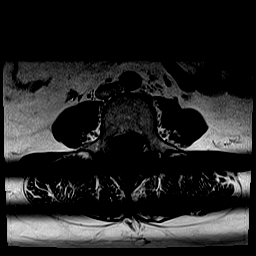
[im 17/34]
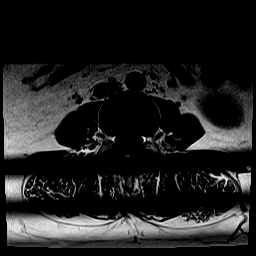
[im 19/34]
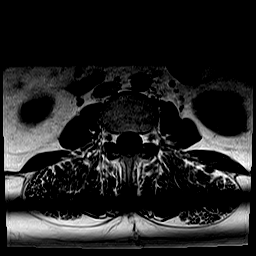
[im 24/34]
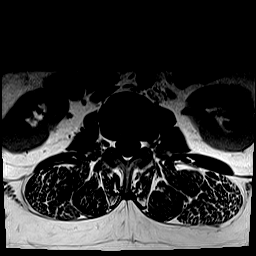
[im 29/34]
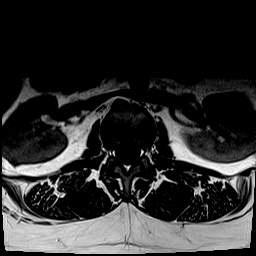
[im 34/34]
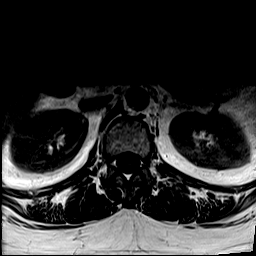

[37 of 48 positions shown; findings below may reference images not displayed]

FINDINGS: Segmentation:  Normal on the comparison radiographs.

Alignment:  Normal lumbar lordosis.

Vertebrae: L5-S1 interbody cage type implants remain in place. Mild
associated hardware susceptibility artifact. Underlying normal bone
marrow signal. No marrow edema or evidence of acute osseous
abnormality. Intact visible sacrum and SI joints.

Conus medullaris and cauda equina: Conus extends to the L1-L2 level.
Conus and cauda equina appear normal.

Paraspinal and other soft tissues: Mild postoperative changes to the
posterior paraspinal soft tissues at L5 and S1 levels, greater on
the left. No postoperative fluid collection. Negative visible
abdominal viscera.

Disc levels:

No lower thoracic spinal stenosis. There is mild disc bulging
partially visible at T10-T11.

L1-L2:  Negative.

L2-L3: Small foraminal to extraforaminal disc protrusion on the left
abutting the exiting left L2 nerve (series 5, image 11. Mild facet
hypertrophy.

L3-L4: Negative aside from mild facet and ligament flavum
hypertrophy.

L4-L5: Mild ligament flavum and mild to moderate facet hypertrophy.
Minor disc desiccation and minimal disc bulging. Capacious spinal
canal. There is a superimposed small right foraminal disc protrusion
best seen on series 5, image 23. There is mild to moderate
associated right L4 foraminal stenosis. No left foraminal stenosis.

L5-S1: Postoperative changes with cage type interbody implants.
Prior decompression. Posterior element solid arthrodesis suspected.
Capacious thecal sac. Mild architectural distortion at the right
lateral recess. No foraminal stenosis.
IMPRESSION: 1. No lumbar spinal stenosis. Prior decompression and fusion at
L5-S1 with no adverse features.
2. Fairly mild adjacent segment disease at L4-L5, primarily
posterior element hypertrophy. However, there is a small right
foraminal disc protrusion. Query right L4 radiculitis.
3. Small left foraminal or extraforaminal disc protrusion at L2-L3.
Query left L2 radiculitis.

## 2019-10-22 DIAGNOSIS — Z6835 Body mass index (BMI) 35.0-35.9, adult: Secondary | ICD-10-CM | POA: Diagnosis not present

## 2019-10-22 DIAGNOSIS — E119 Type 2 diabetes mellitus without complications: Secondary | ICD-10-CM | POA: Diagnosis not present

## 2019-10-22 DIAGNOSIS — E1165 Type 2 diabetes mellitus with hyperglycemia: Secondary | ICD-10-CM | POA: Diagnosis not present

## 2019-11-14 ENCOUNTER — Encounter: Payer: Self-pay | Admitting: General Practice

## 2020-02-23 DIAGNOSIS — I10 Essential (primary) hypertension: Secondary | ICD-10-CM | POA: Diagnosis not present

## 2020-02-23 DIAGNOSIS — E6609 Other obesity due to excess calories: Secondary | ICD-10-CM | POA: Diagnosis not present

## 2020-02-23 DIAGNOSIS — Z6835 Body mass index (BMI) 35.0-35.9, adult: Secondary | ICD-10-CM | POA: Diagnosis not present

## 2020-02-23 DIAGNOSIS — E1165 Type 2 diabetes mellitus with hyperglycemia: Secondary | ICD-10-CM | POA: Diagnosis not present

## 2020-03-05 ENCOUNTER — Telehealth: Payer: BLUE CROSS/BLUE SHIELD | Admitting: Nurse Practitioner

## 2020-03-07 DIAGNOSIS — M5126 Other intervertebral disc displacement, lumbar region: Secondary | ICD-10-CM | POA: Diagnosis not present

## 2020-03-07 DIAGNOSIS — M5442 Lumbago with sciatica, left side: Secondary | ICD-10-CM | POA: Diagnosis not present

## 2020-03-07 DIAGNOSIS — M62830 Muscle spasm of back: Secondary | ICD-10-CM | POA: Diagnosis not present

## 2020-03-07 DIAGNOSIS — I2510 Atherosclerotic heart disease of native coronary artery without angina pectoris: Secondary | ICD-10-CM | POA: Diagnosis not present

## 2020-03-07 DIAGNOSIS — M546 Pain in thoracic spine: Secondary | ICD-10-CM | POA: Diagnosis not present

## 2020-03-07 DIAGNOSIS — I700 Atherosclerosis of aorta: Secondary | ICD-10-CM | POA: Diagnosis not present

## 2020-03-07 DIAGNOSIS — M5441 Lumbago with sciatica, right side: Secondary | ICD-10-CM | POA: Diagnosis not present

## 2020-03-07 DIAGNOSIS — M5124 Other intervertebral disc displacement, thoracic region: Secondary | ICD-10-CM | POA: Diagnosis not present

## 2020-03-07 DIAGNOSIS — J439 Emphysema, unspecified: Secondary | ICD-10-CM | POA: Diagnosis not present

## 2020-03-07 DIAGNOSIS — M5020 Other cervical disc displacement, unspecified cervical region: Secondary | ICD-10-CM | POA: Diagnosis not present

## 2020-03-07 DIAGNOSIS — M5021 Other cervical disc displacement,  high cervical region: Secondary | ICD-10-CM | POA: Diagnosis not present

## 2020-03-20 DIAGNOSIS — K5730 Diverticulosis of large intestine without perforation or abscess without bleeding: Secondary | ICD-10-CM | POA: Diagnosis not present

## 2020-03-20 DIAGNOSIS — R6883 Chills (without fever): Secondary | ICD-10-CM | POA: Diagnosis not present

## 2020-03-20 DIAGNOSIS — R110 Nausea: Secondary | ICD-10-CM | POA: Diagnosis not present

## 2020-03-20 DIAGNOSIS — R1084 Generalized abdominal pain: Secondary | ICD-10-CM | POA: Diagnosis not present

## 2020-03-20 DIAGNOSIS — I878 Other specified disorders of veins: Secondary | ICD-10-CM | POA: Diagnosis not present

## 2020-03-20 DIAGNOSIS — K5900 Constipation, unspecified: Secondary | ICD-10-CM | POA: Diagnosis not present

## 2020-03-20 DIAGNOSIS — D734 Cyst of spleen: Secondary | ICD-10-CM | POA: Diagnosis not present

## 2020-03-20 DIAGNOSIS — R197 Diarrhea, unspecified: Secondary | ICD-10-CM | POA: Diagnosis not present

## 2020-03-20 DIAGNOSIS — R300 Dysuria: Secondary | ICD-10-CM | POA: Diagnosis not present

## 2020-03-20 DIAGNOSIS — M545 Low back pain: Secondary | ICD-10-CM | POA: Diagnosis not present

## 2020-03-20 DIAGNOSIS — G8929 Other chronic pain: Secondary | ICD-10-CM | POA: Diagnosis not present

## 2020-03-20 DIAGNOSIS — I10 Essential (primary) hypertension: Secondary | ICD-10-CM | POA: Diagnosis not present

## 2020-03-20 DIAGNOSIS — I700 Atherosclerosis of aorta: Secondary | ICD-10-CM | POA: Diagnosis not present

## 2020-03-21 DIAGNOSIS — I878 Other specified disorders of veins: Secondary | ICD-10-CM | POA: Diagnosis not present

## 2020-03-21 DIAGNOSIS — K5900 Constipation, unspecified: Secondary | ICD-10-CM | POA: Diagnosis not present

## 2020-03-21 DIAGNOSIS — G8929 Other chronic pain: Secondary | ICD-10-CM | POA: Diagnosis not present

## 2020-03-21 DIAGNOSIS — R1084 Generalized abdominal pain: Secondary | ICD-10-CM | POA: Diagnosis not present

## 2020-03-21 DIAGNOSIS — D734 Cyst of spleen: Secondary | ICD-10-CM | POA: Diagnosis not present

## 2020-03-21 DIAGNOSIS — M545 Low back pain: Secondary | ICD-10-CM | POA: Diagnosis not present

## 2020-03-21 DIAGNOSIS — K5730 Diverticulosis of large intestine without perforation or abscess without bleeding: Secondary | ICD-10-CM | POA: Diagnosis not present

## 2020-03-21 DIAGNOSIS — I700 Atherosclerosis of aorta: Secondary | ICD-10-CM | POA: Diagnosis not present

## 2020-03-29 DIAGNOSIS — M5134 Other intervertebral disc degeneration, thoracic region: Secondary | ICD-10-CM | POA: Diagnosis not present

## 2020-03-29 DIAGNOSIS — M5030 Other cervical disc degeneration, unspecified cervical region: Secondary | ICD-10-CM | POA: Diagnosis not present

## 2020-03-29 DIAGNOSIS — M5136 Other intervertebral disc degeneration, lumbar region: Secondary | ICD-10-CM | POA: Diagnosis not present

## 2020-06-28 DIAGNOSIS — E782 Mixed hyperlipidemia: Secondary | ICD-10-CM | POA: Diagnosis not present

## 2020-06-28 DIAGNOSIS — E1165 Type 2 diabetes mellitus with hyperglycemia: Secondary | ICD-10-CM | POA: Diagnosis not present

## 2020-12-27 ENCOUNTER — Encounter: Payer: Self-pay | Admitting: Gastroenterology

## 2021-06-28 ENCOUNTER — Encounter: Payer: Self-pay | Admitting: Family Medicine

## 2021-06-28 ENCOUNTER — Other Ambulatory Visit: Payer: Self-pay

## 2021-06-28 ENCOUNTER — Ambulatory Visit: Payer: BC Managed Care – PPO | Admitting: Family Medicine

## 2021-06-28 VITALS — BP 136/82 | HR 86 | Temp 97.0°F | Ht 60.0 in | Wt 171.0 lb

## 2021-06-28 DIAGNOSIS — Z0000 Encounter for general adult medical examination without abnormal findings: Secondary | ICD-10-CM

## 2021-06-28 DIAGNOSIS — J301 Allergic rhinitis due to pollen: Secondary | ICD-10-CM | POA: Diagnosis not present

## 2021-06-28 DIAGNOSIS — R300 Dysuria: Secondary | ICD-10-CM | POA: Diagnosis not present

## 2021-06-28 DIAGNOSIS — Z720 Tobacco use: Secondary | ICD-10-CM | POA: Diagnosis not present

## 2021-06-28 DIAGNOSIS — E7800 Pure hypercholesterolemia, unspecified: Secondary | ICD-10-CM | POA: Insufficient documentation

## 2021-06-28 LAB — URINALYSIS, ROUTINE W REFLEX MICROSCOPIC
Bilirubin Urine: NEGATIVE
Ketones, ur: NEGATIVE
Leukocytes,Ua: NEGATIVE
Nitrite: NEGATIVE
RBC / HPF: NONE SEEN (ref 0–?)
Specific Gravity, Urine: 1.02 (ref 1.000–1.030)
Total Protein, Urine: NEGATIVE
Urine Glucose: NEGATIVE
Urobilinogen, UA: 0.2 (ref 0.0–1.0)
pH: 7 (ref 5.0–8.0)

## 2021-06-28 LAB — MICROALBUMIN / CREATININE URINE RATIO
Creatinine,U: 113.8 mg/dL
Microalb Creat Ratio: 0.7 mg/g (ref 0.0–30.0)
Microalb, Ur: 0.8 mg/dL (ref 0.0–1.9)

## 2021-06-28 MED ORDER — FLUTICASONE PROPIONATE 50 MCG/ACT NA SUSP: 2.0000 | Freq: Every day | NASAL | 6 refills | Status: AC

## 2021-06-28 NOTE — Progress Notes (Signed)
New Patient Office Visit  Subjective:  Patient ID: Barbara Dyer, female    DOB: 1966/04/14  Age: 55 y.o. MRN: [id number]  CC:  Chief Complaint  Patient presents with   Follow-up    Routine follow up, patient needing referral for mammogram. Concerns about possible bladder infection.     HPI Barbara Dyer presents for follow-up for referral for mammogram.  She is also been experiencing some mild dysuria without frequency or urgency.  There is no vaginal discharge.  No flank pain fevers or chills.  Status post TAH and has been cleared for the need of future Pap smears.  She is seeing endocrinology for her diabetes.  It is currently treated with a natural substance.  Chart review shows last hemoglobin A1c was 7.6.  She was advised to exercise and lose weight.  Seen orthopedics for her chronic aches and pains.  Close follow-up with gastroenterology.  Last colonoscopy was just this past year.  No mammogram in 10 years.  She is married and in a stable relationship.  Her husband is also my patient.  Past Medical History:  Diagnosis Date   Arthritis    Crohn's colitis ([place]) 2009   Diabetes mellitus without complication (HCC)    F6C 6.4   Hyperlipemia    Most recent LDL 94   Hypertension    Not previously diagnosed    Past Surgical History:  Procedure Laterality Date   ABDOMINAL HYSTERECTOMY     plus tubes and ovaries 2nd surgery   APPENDECTOMY     BACK SURGERY     BREAST SURGERY Left    bx   CARPAL TUNNEL RELEASE Bilateral    CHOLECYSTECTOMY     DIAGNOSTIC LAPAROSCOPY     x3 for endometriosis   KNEE ARTHROSCOPY Bilateral    TOTAL KNEE ARTHROPLASTY Right 12/30/2014   Procedure: TOTAL KNEE ARTHROPLASTY;  Surgeon: Kathryne Hitch, MD;  Location: [facility];  Service: Orthopedics;  Laterality: Right;    Family History  Problem Relation Age of Onset   Diverticulosis Mother    Diabetes Mother    Heart failure Mother    Heart failure Father    Stroke Father    Diabetes Sister     Kidney disease Paternal Aunt    Lung cancer Paternal Grandfather    Colon cancer Neg Hx    Stomach cancer Neg Hx     Social History   Socioeconomic History   Marital status: Single    Spouse name: Not on file   Number of children: 2   Years of education: Not on file   Highest education level: Not on file  Occupational History   Occupation: housewife  Tobacco Use   Smoking status: Every Day    Packs/day: 1.00    Years: 25.00    Pack years: 25.00    Types: Cigarettes   Smokeless tobacco: Never   Tobacco comments:    10-05-2015  Vaping Use   Vaping Use: Never used  Substance and Sexual Activity   Alcohol use: No    Alcohol/week: 0.0 standard drinks   Drug use: Yes    Types: Marijuana   Sexual activity: Not Currently  Other Topics Concern   Not on file  Social History Narrative   She is a married mother of 2, grandmother 1.  Smokes 1 pack a day.  Walks about 10 minutes at a time 2 days a week.   Social Determinants of Health   Financial Resource Strain: Not on  file  Food Insecurity: Not on file  Transportation Needs: Not on file  Physical Activity: Not on file  Stress: Not on file  Social Connections: Not on file  Intimate Partner Violence: Not on file    ROS Review of Systems  Constitutional:  Negative for chills, diaphoresis, fatigue, fever and unexpected weight change.  HENT:  Positive for congestion, postnasal drip, rhinorrhea and sneezing.   Eyes:  Positive for itching. Negative for photophobia and visual disturbance.  Respiratory: Negative.    Cardiovascular: Negative.   Gastrointestinal: Negative.   Endocrine: Negative for polyphagia and polyuria.  Genitourinary:  Positive for dysuria. Negative for difficulty urinating, flank pain, frequency, hematuria, urgency and vaginal discharge.  Musculoskeletal:  Positive for arthralgias and back pain.  Skin:  Negative for pallor and rash.  Neurological:  Negative for speech difficulty and weakness.   Objective:    Today's Vitals: BP 136/82 (BP Location: Right Arm, Patient Position: Sitting, Cuff Size: Normal)   Pulse 86   Temp (!) 97 F (36.1 C) (Temporal)   Ht 5' (1.524 m)   Wt 171 lb (77.6 kg)   SpO2 95%   BMI 33.40 kg/m   Physical Exam Vitals and nursing note reviewed.  Constitutional:      General: She is not in acute distress.    Appearance: Normal appearance. She is not ill-appearing, toxic-appearing or diaphoretic.  HENT:     Head: Normocephalic and atraumatic.     Right Ear: Tympanic membrane, ear canal and external ear normal.     Left Ear: Tympanic membrane, ear canal and external ear normal.     Mouth/Throat:     Mouth: Mucous membranes are moist.     Pharynx: Oropharynx is clear. No oropharyngeal exudate or posterior oropharyngeal erythema.  Eyes:     General: No scleral icterus.       Right eye: No discharge.        Left eye: No discharge.     Extraocular Movements: Extraocular movements intact.     Conjunctiva/sclera: Conjunctivae normal.     Pupils: Pupils are equal, round, and reactive to light.  Neck:     Vascular: No carotid bruit.  Cardiovascular:     Rate and Rhythm: Normal rate and regular rhythm.  Pulmonary:     Effort: Pulmonary effort is normal.     Breath sounds: Normal breath sounds.  Abdominal:     General: Bowel sounds are normal.  Musculoskeletal:     Cervical back: No rigidity or tenderness.  Lymphadenopathy:     Cervical: No cervical adenopathy.  Skin:    General: Skin is warm and dry.  Neurological:     Mental Status: She is alert and oriented to person, place, and time.  Psychiatric:        Behavior: Behavior normal.    Assessment & Plan:   Problem List Items Addressed This Visit       Respiratory   Seasonal allergic rhinitis due to pollen   Relevant Medications   fluticasone (FLONASE) 50 MCG/ACT nasal spray     Other   Healthcare maintenance - Primary   Relevant Orders   MM Digital Screening   Tobacco use   Dysuria    Relevant Orders   Urinalysis, Routine w reflex microscopic   Microalbumin / creatinine urine ratio    Outpatient Encounter Medications as of 06/28/2021  Medication Sig   Alum & Mag Hydroxide-Simeth (ANTACID ANTI-GAS PO) Take 1 capsule by mouth as needed.   AMBULATORY NON  FORMULARY MEDICATION Nitroglycerine ointment 0.125 %  Apply a pea sized amount internally four times daily. Dispense 30 GM zero refill   Apple Cider Vinegar 188 MG CAPS Take 1 capsule by mouth daily.   aspirin 81 MG tablet Take 81 mg by mouth every other day.    Barberry-Oreg Grape-Goldenseal (BERBERINE COMPLEX PO) Take 1 capsule by mouth daily.   Flaxseed, Linseed, (FLAXSEED OIL) 1000 MG CAPS Take 1 capsule by mouth daily.   fluticasone (FLONASE) 50 MCG/ACT nasal spray SPRAY 2 SPRAYS INTO EACH NOSTRIL EVERY DAY   fluticasone (FLONASE) 50 MCG/ACT nasal spray Place 2 sprays into both nostrils daily.   Ginkgo Biloba (GINKOBA PO) Take 1 capsule by mouth daily.   guaiFENesin (MUCINEX) 600 MG 12 hr tablet Take 600 mg by mouth 2 (two) times daily as needed for cough.   Melatonin 5 MG TABS Take 5 mg by mouth at bedtime as needed.   Multiple Vitamin (MULTIVITAMIN) tablet Take 2 tablets by mouth daily.    OVER THE COUNTER MEDICATION Take 2 capsules by mouth daily. "Vita Fusion-Vitamin D3/Bone health/Immune System" supplement   PAPAYA ENZYME PO Take by mouth.   sodium chloride (OCEAN) 0.65 % SOLN nasal spray Place 1 spray into both nostrils as needed for congestion.   tiZANidine (ZANAFLEX) 4 MG capsule Take 4 mg by mouth 3 (three) times daily.   mesalamine (APRISO) 0.375 g 24 hr capsule Take 4 capsules once daily (Patient not taking: Reported on 06/28/2021)   [DISCONTINUED] cyclobenzaprine (FLEXERIL) 10 MG tablet TAKE 1 TABLET BY MOUTH 2 (TWO) TIMES DAILY AS NEEDED FOR UP TO 10 DAYS FOR MUSCLE SPASMS.   [DISCONTINUED] diltiazem 2 % GEL Apply 1 application topically 2 (two) times daily.   [DISCONTINUED] DULoxetine (CYMBALTA) 20 MG  capsule Take 1 capsule (20 mg total) by mouth 2 (two) times daily. (Patient not taking: Reported on 02/19/2018)   [DISCONTINUED] hydrOXYzine (ATARAX/VISTARIL) 10 MG tablet TAKE 1 TABLET BY MOUTH THREE TIMES A DAY AS NEEDED   [DISCONTINUED] methocarbamol (ROBAXIN) 500 MG tablet TAKE 1 TABLET (500 MG TOTAL) BY MOUTH EVERY 8 (EIGHT) HOURS AS NEEDED FOR MUSCLE SPASMS.   [DISCONTINUED] oxyCODONE-acetaminophen (PERCOCET) 7.5-325 MG tablet Take 1 tablet by mouth every 8 (eight) hours as needed for severe pain.   No facility-administered encounter medications on file as of 06/28/2021.    Follow-up: Return in about 6 months (around 12/27/2021).  Encouraged her to continue her tobacco cessation journey.  Given information to help her to quit smoking.  Continue follow-up with orthopedics for chronic aches and pains, endocrinology for diabetes and gastroenterology.  Libby Maw, MD

## 2021-07-21 ENCOUNTER — Telehealth: Payer: Self-pay | Admitting: Family Medicine

## 2021-07-21 NOTE — Telephone Encounter (Signed)
Tried calling patient to inform of referral and to hold on injection. Contact number not working will call back.

## 2021-07-21 NOTE — Telephone Encounter (Signed)
Pt called and is requesting referral for pain management, she is wanting to get an injection for pain. Shes says its ongoing pain. Pain in back, bursitis, and in her hips, call back if needed [phone]

## 2021-07-21 NOTE — Telephone Encounter (Signed)
Please advise message belwo, okay to refer to pain management? Patient would also like to come in for cortisone injection.

## 2021-07-27 NOTE — Telephone Encounter (Signed)
Spoke with patient who states that her phone is currently out of service right now and she would prefer to wait for the referral to pain management until her phone is back on. Patient states that she will give [place] a call back to let us know when to put in referral when she is able to receive calls.

## 2021-07-29 ENCOUNTER — Telehealth: Payer: Self-pay

## 2021-07-29 NOTE — Telephone Encounter (Signed)
Tried to call patient to make an appt to see Dr Gena Fray, per [place].  Call went to vm mailbox was full.

## 2021-07-29 NOTE — Telephone Encounter (Signed)
Pt called, crying. She said its been over a month that she went to [facility] for hand pain. She said they did blood work but has never heard any results. She stated she was in severe pain. She said she wants to speak with Dr Ethelene Hal only. I see a 07/16/21 ER visit at [facility] and 07/14/21 visit to [facility] on 07/14/21. I don't see any ER visits for this year. Please advise.I wasn't sure what to tell her.

## 2021-08-08 NOTE — Telephone Encounter (Signed)
Pt called to speak with Dr Ethelene Hal... says [facility] refuses to release her medical records.

## 2021-08-09 NOTE — Telephone Encounter (Signed)
Returned patients call no answer unable to LM.

## 2021-08-15 NOTE — Telephone Encounter (Signed)
Pt called back regarding [facility] not releasing her records. I let her know that Lacie Scotts tried to call  on 11/29 and her vm was full. She said it is cleaned out now, same # on file

## 2021-08-15 NOTE — Telephone Encounter (Signed)
Spoke with patient who was calling to see if we had results from hospital visit at [facility] patient verbally understood we are not able to pull up those records she would need to give them a call. Patient also mentioned having something sent in for hand and back pain but said she would check with one of her other doctors.

## 2023-04-20 ENCOUNTER — Telehealth: Payer: Self-pay | Admitting: Family Medicine

## 2023-04-20 NOTE — Telephone Encounter (Signed)
Patient has not been seen by current PCP in a year. Reached out to the patient to see if they have a new PCP or if they would like to continue care with the provider.  Left voicemail to schedule

## 2024-09-16 ENCOUNTER — Ambulatory Visit: Admitting: Nurse Practitioner

## 2024-10-16 ENCOUNTER — Ambulatory Visit: Admitting: Nurse Practitioner
